# Patient Record
Sex: Female | Born: 1980 | Race: Black or African American | Hispanic: No | Marital: Married | Smoking: Never smoker
Health system: Southern US, Community
[De-identification: ages and names within clinical notes are randomized; demographics above are authoritative.]

## PROBLEM LIST (undated history)

## (undated) DIAGNOSIS — Z789 Other specified health status: Secondary | ICD-10-CM

## (undated) HISTORY — DX: Other specified health status: Z78.9

---

## 2014-01-10 NOTE — L&D Delivery Note (Signed)
Delivery Note  Patient was admitted for PROM. She was contracting regularly but was slow to progress, staying dilated at 2 cm for approximately 5 hours since admission, so she was started on pitocin to augment labor. She progressed to completion with a lip and delivered precipitously after voiding at bedside commode.   At 6:29 PM a viable and healthy female was delivered via Vaginal, Spontaneous Delivery (Presentation: Right Occiput Anterior).  APGAR: 8, 9; weight pending.   Placenta status: Intact, Spontaneous.  Cord:  with the following complications: None.  Cord pH: N/A  Anesthesia: None  Episiotomy: None Lacerations: None  Suture Repair: N/A Est. Blood Loss (mL):  100  Mom to postpartum.  Baby to Couplet care / Skin to Skin.  Jamelle Haring 10/18/2014, 6:56 PM

## 2014-09-24 ENCOUNTER — Encounter: Payer: Self-pay | Admitting: Physician Assistant

## 2014-09-24 ENCOUNTER — Ambulatory Visit (INDEPENDENT_AMBULATORY_CARE_PROVIDER_SITE_OTHER): Payer: Medicaid Other | Admitting: Physician Assistant

## 2014-09-24 VITALS — BP 117/72 | HR 75 | Temp 98.5°F | Ht 61.0 in | Wt 139.8 lb

## 2014-09-24 DIAGNOSIS — Z113 Encounter for screening for infections with a predominantly sexual mode of transmission: Secondary | ICD-10-CM

## 2014-09-24 DIAGNOSIS — O0933 Supervision of pregnancy with insufficient antenatal care, third trimester: Secondary | ICD-10-CM | POA: Diagnosis not present

## 2014-09-24 DIAGNOSIS — Z1151 Encounter for screening for human papillomavirus (HPV): Secondary | ICD-10-CM | POA: Diagnosis not present

## 2014-09-24 DIAGNOSIS — O0930 Supervision of pregnancy with insufficient antenatal care, unspecified trimester: Secondary | ICD-10-CM | POA: Diagnosis not present

## 2014-09-24 DIAGNOSIS — O09293 Supervision of pregnancy with other poor reproductive or obstetric history, third trimester: Secondary | ICD-10-CM

## 2014-09-24 DIAGNOSIS — O09899 Supervision of other high risk pregnancies, unspecified trimester: Secondary | ICD-10-CM | POA: Insufficient documentation

## 2014-09-24 DIAGNOSIS — Z98891 History of uterine scar from previous surgery: Secondary | ICD-10-CM

## 2014-09-24 DIAGNOSIS — Z789 Other specified health status: Secondary | ICD-10-CM

## 2014-09-24 DIAGNOSIS — Z124 Encounter for screening for malignant neoplasm of cervix: Secondary | ICD-10-CM | POA: Diagnosis not present

## 2014-09-24 DIAGNOSIS — O09893 Supervision of other high risk pregnancies, third trimester: Secondary | ICD-10-CM

## 2014-09-24 DIAGNOSIS — O09299 Supervision of pregnancy with other poor reproductive or obstetric history, unspecified trimester: Secondary | ICD-10-CM | POA: Insufficient documentation

## 2014-09-24 DIAGNOSIS — Z23 Encounter for immunization: Secondary | ICD-10-CM | POA: Diagnosis not present

## 2014-09-24 DIAGNOSIS — Z118 Encounter for screening for other infectious and parasitic diseases: Secondary | ICD-10-CM | POA: Diagnosis not present

## 2014-09-24 DIAGNOSIS — O34219 Maternal care for unspecified type scar from previous cesarean delivery: Secondary | ICD-10-CM | POA: Insufficient documentation

## 2014-09-24 DIAGNOSIS — Z9889 Other specified postprocedural states: Secondary | ICD-10-CM | POA: Diagnosis not present

## 2014-09-24 LAB — POCT URINALYSIS DIP (DEVICE)
BILIRUBIN URINE: NEGATIVE
Glucose, UA: NEGATIVE mg/dL
KETONES UR: NEGATIVE mg/dL
Nitrite: NEGATIVE
PH: 6 (ref 5.0–8.0)
Protein, ur: NEGATIVE mg/dL
Specific Gravity, Urine: 1.025 (ref 1.005–1.030)
Urobilinogen, UA: 0.2 mg/dL (ref 0.0–1.0)

## 2014-09-24 LAB — GLUCOSE TOLERANCE, 1 HOUR (50G) W/O FASTING: GLUCOSE 1 HOUR GTT: 86 mg/dL (ref 70–140)

## 2014-09-24 MED ORDER — TETANUS-DIPHTH-ACELL PERTUSSIS 5-2.5-18.5 LF-MCG/0.5 IM SUSP
0.5000 mL | Freq: Once | INTRAMUSCULAR | Status: AC
  Administered 2014-09-24: 0.5 mL via INTRAMUSCULAR

## 2014-09-24 MED ORDER — PRENATAL VITAMINS 0.8 MG PO TABS: 1.0000 | ORAL_TABLET | Freq: Every day | ORAL | Status: AC

## 2014-09-24 NOTE — Patient Instructions (Signed)
Third Trimester of Pregnancy The third trimester is from week 29 through week 42, months 7 through 9. The third trimester is a time when the fetus is growing rapidly. At the end of the ninth month, the fetus is about 20 inches in length and weighs 6-10 pounds.  BODY CHANGES Your body goes through many changes during pregnancy. The changes vary from woman to woman.   Your weight will continue to increase. You can expect to gain 25-35 pounds (11-16 kg) by the end of the pregnancy.  You may begin to get stretch marks on your hips, abdomen, and breasts.  You may urinate more often because the fetus is moving lower into your pelvis and pressing on your bladder.  You may develop or continue to have heartburn as a result of your pregnancy.  You may develop constipation because certain hormones are causing the muscles that push waste through your intestines to slow down.  You may develop hemorrhoids or swollen, bulging veins (varicose veins).  You may have pelvic pain because of the weight gain and pregnancy hormones relaxing your joints between the bones in your pelvis. Backaches may result from overexertion of the muscles supporting your posture.  You may have changes in your hair. These can include thickening of your hair, rapid growth, and changes in texture. Some women also have hair loss during or after pregnancy, or hair that feels dry or thin. Your hair will most likely return to normal after your baby is born.  Your breasts will continue to grow and be tender. A yellow discharge may leak from your breasts called colostrum.  Your belly button may stick out.  You may feel short of breath because of your expanding uterus.  You may notice the fetus "dropping," or moving lower in your abdomen.  You may have a bloody mucus discharge. This usually occurs a few days to a week before labor begins.  Your cervix becomes thin and soft (effaced) near your due date. WHAT TO EXPECT AT YOUR PRENATAL  EXAMS  You will have prenatal exams every 2 weeks until week 36. Then, you will have weekly prenatal exams. During a routine prenatal visit:  You will be weighed to make sure you and the fetus are growing normally.  Your blood pressure is taken.  Your abdomen will be measured to track your baby's growth.  The fetal heartbeat will be listened to.  Any test results from the previous visit will be discussed.  You may have a cervical check near your due date to see if you have effaced. At around 36 weeks, your caregiver will check your cervix. At the same time, your caregiver will also perform a test on the secretions of the vaginal tissue. This test is to determine if a type of bacteria, Group B streptococcus, is present. Your caregiver will explain this further. Your caregiver may ask you:  What your birth plan is.  How you are feeling.  If you are feeling the baby move.  If you have had any abnormal symptoms, such as leaking fluid, bleeding, severe headaches, or abdominal cramping.  If you have any questions. Other tests or screenings that may be performed during your third trimester include:  Blood tests that check for low iron levels (anemia).  Fetal testing to check the health, activity level, and growth of the fetus. Testing is done if you have certain medical conditions or if there are problems during the pregnancy. FALSE LABOR You may feel small, irregular contractions that   eventually go away. These are called Braxton Hicks contractions, or false labor. Contractions may last for hours, days, or even weeks before true labor sets in. If contractions come at regular intervals, intensify, or become painful, it is best to be seen by your caregiver.  SIGNS OF LABOR   Menstrual-like cramps.  Contractions that are 5 minutes apart or less.  Contractions that start on the top of the uterus and spread down to the lower abdomen and back.  A sense of increased pelvic pressure or back  pain.  A watery or bloody mucus discharge that comes from the vagina. If you have any of these signs before the 37th week of pregnancy, call your caregiver right away. You need to go to the hospital to get checked immediately. HOME CARE INSTRUCTIONS   Avoid all smoking, herbs, alcohol, and unprescribed drugs. These chemicals affect the formation and growth of the baby.  Follow your caregiver's instructions regarding medicine use. There are medicines that are either safe or unsafe to take during pregnancy.  Exercise only as directed by your caregiver. Experiencing uterine cramps is a good sign to stop exercising.  Continue to eat regular, healthy meals.  Wear a good support bra for breast tenderness.  Do not use hot tubs, steam rooms, or saunas.  Wear your seat belt at all times when driving.  Avoid raw meat, uncooked cheese, cat litter boxes, and soil used by cats. These carry germs that can cause birth defects in the baby.  Take your prenatal vitamins.  Try taking a stool softener (if your caregiver approves) if you develop constipation. Eat more high-fiber foods, such as fresh vegetables or fruit and whole grains. Drink plenty of fluids to keep your urine clear or pale yellow.  Take warm sitz baths to soothe any pain or discomfort caused by hemorrhoids. Use hemorrhoid cream if your caregiver approves.  If you develop varicose veins, wear support hose. Elevate your feet for 15 minutes, 3-4 times a day. Limit salt in your diet.  Avoid heavy lifting, wear low heal shoes, and practice good posture.  Rest a lot with your legs elevated if you have leg cramps or low back pain.  Visit your dentist if you have not gone during your pregnancy. Use a soft toothbrush to brush your teeth and be gentle when you floss.  A sexual relationship may be continued unless your caregiver directs you otherwise.  Do not travel far distances unless it is absolutely necessary and only with the approval  of your caregiver.  Take prenatal classes to understand, practice, and ask questions about the labor and delivery.  Make a trial run to the hospital.  Pack your hospital bag.  Prepare the baby's nursery.  Continue to go to all your prenatal visits as directed by your caregiver. SEEK MEDICAL CARE IF:  You are unsure if you are in labor or if your water has broken.  You have dizziness.  You have mild pelvic cramps, pelvic pressure, or nagging pain in your abdominal area.  You have persistent nausea, vomiting, or diarrhea.  You have a bad smelling vaginal discharge.  You have pain with urination. SEEK IMMEDIATE MEDICAL CARE IF:   You have a fever.  You are leaking fluid from your vagina.  You have spotting or bleeding from your vagina.  You have severe abdominal cramping or pain.  You have rapid weight loss or gain.  You have shortness of breath with chest pain.  You notice sudden or extreme swelling   of your face, hands, ankles, feet, or legs.  You have not felt your baby move in over an hour.  You have severe headaches that do not go away with medicine.  You have vision changes. Document Released: 12/21/2000 Document Revised: 01/01/2013 Document Reviewed: 02/28/2012 [employer] Patient Information 2015 [employer]. This information is not intended to replace advice given to you by your health care provider. Make sure you discuss any questions you have with your health care provider.  

## 2014-09-24 NOTE — Progress Notes (Signed)
Initial OB appointment New OB labs with 1 hour gtt New OB/28 wk Educational material given Pt needs written prescriptions Flu/Tdap vaccine Interpreter present for encounter Schedule ultrasound Hgb: trace, Leukocytes: moderate Breastfeeding tip of the week reviewed

## 2014-09-24 NOTE — Progress Notes (Signed)
  Subjective:    Suzanne Mccoy is a [id number] [redacted]w[redacted]d being seen today for her first obstetrical visit.  Her obstetrical history is significant for prior c-section for breech and IUFD (unknown cause).. Patient does intend to breast feed. Pregnancy history fully reviewed.  Patient reports no complaints.  Filed Vitals:   Oct 08, 2014 0804 08-Oct-2014 0806  BP: 117/72   Pulse: 75   Temp: 98.5 F (36.9 C)   Height:   (1.549 m)  Weight: 139 lb 12.8 oz (63.413 kg)     HISTORY: OB History  Gravida Para Term Preterm AB SAB TAB Ectopic Multiple Living  # Outcome Date GA Lbr Len/2nd Weight Sex Delivery Anes PTL Lv  5 Current           4 Term 06/11/11 [redacted]w[redacted]d   M    FD  3 Term 10/11/06 [redacted]w[redacted]d   F CS-Unspec   Y  2 Term 08/10/01 [redacted]w[redacted]d   Kateri Plummer Y  1 Term 07/10/00 [redacted]w[redacted]d   F Vag-Spont   Y     Past Medical History  Diagnosis Date  . Medical history non-contributory    Past Surgical History  Procedure Laterality Date  . Cesarean section     History reviewed. No pertinent family history.   Exam    Uterus:   gravid  Pelvic Exam:    Perineum: No Hemorrhoids, Normal Perineum   Vulva: normal   Vagina:  normal discharge   pH:     Cervix: no cervical motion tenderness and bleeding present following pap   Adnexa: normal adnexa   Bony Pelvis: average  System: Breast:  Inspection negative   Skin: normal coloration and turgor, no rashes    Neurologic: oriented, grossly non-focal, normal mood   Extremities: normal strength, tone, and muscle mass   HEENT extra ocular movement intact   Mouth/Teeth mucous membranes moist, pharynx normal without lesions and dental hygiene poor   Neck supple   Cardiovascular: regular rate and rhythm   Respiratory:  appears well, vitals normal, no respiratory distress, acyanotic, normal RR, ear and throat exam is normal, neck free of mass or lymphadenopathy, chest clear, no wheezing, crepitations, rhonchi, normal symmetric air entry   Abdomen: soft, non-tender; bowel sounds normal; no masses,  no organomegaly   Urinary: urethral meatus normal      Assessment:    Pregnancy: [id number] Patient Active Problem List   Diagnosis Date Noted  . Supervision of other high-risk pregnancy 2014-10-08  . Hx of intrauterine fetal death, currently pregnant 10/08/2014  . Late prenatal care affecting pregnancy in third trimester, antepartum 08-Oct-2014  . H/O: C-section 10-08-2014  . Language barrier affecting health care October 08, 2014        Plan:     Initial labs drawn. Prenatal vitamins. Rx provided Problem list reviewed and updated. Genetic Screening discussed Quad Screen: too late.  Ultrasound discussed; fetal survey: ordered. 09/29/14  Follow up in 1 weeks. 90% of 45 min visit spent on counseling and coordination of care.  VBAC consent signed with interpreter One hour gtt today     Bertram Denver 08-Oct-2014

## 2014-09-25 LAB — CYTOLOGY - PAP

## 2014-09-25 LAB — GC/CHLAMYDIA PROBE AMP (~~LOC~~) NOT AT ARMC
CHLAMYDIA, DNA PROBE: NEGATIVE
NEISSERIA GONORRHEA: NEGATIVE

## 2014-09-25 LAB — CULTURE, OB URINE
Colony Count: NO GROWTH
ORGANISM ID, BACTERIA: NO GROWTH

## 2014-09-26 ENCOUNTER — Encounter: Payer: Self-pay | Admitting: *Deleted

## 2014-09-26 LAB — HEMOGLOBINOPATHY EVALUATION
HGB A2 QUANT: 2.6 % (ref 2.2–3.2)
HGB A: 97.4 % (ref 96.8–97.8)
Hemoglobin Other: 0 %
Hgb F Quant: 0 % (ref 0.0–2.0)
Hgb S Quant: 0 %

## 2014-09-26 LAB — PRESCRIPTION MONITORING PROFILE (19 PANEL)
Amphetamine/Meth: NEGATIVE ng/mL
BARBITURATE SCREEN, URINE: NEGATIVE ng/mL
Benzodiazepine Screen, Urine: NEGATIVE ng/mL
Buprenorphine, Urine: NEGATIVE ng/mL
CANNABINOID SCRN UR: NEGATIVE ng/mL
CARISOPRODOL, URINE: NEGATIVE ng/mL
COCAINE METABOLITES: NEGATIVE ng/mL
CREATININE, URINE: 96.02 mg/dL (ref >20.0)
Fentanyl, Ur: NEGATIVE ng/mL
MDMA URINE: NEGATIVE ng/mL
MEPERIDINE UR: NEGATIVE ng/mL
METHADONE SCREEN, URINE: NEGATIVE ng/mL
Methaqualone: NEGATIVE ng/mL
Nitrites, Initial: NEGATIVE ug/mL
OPIATE SCREEN, URINE: NEGATIVE ng/mL
OXYCODONE SCRN UR: NEGATIVE ng/mL
PH URINE, INITIAL: 5.8 pH (ref 4.5–8.9)
Phencyclidine, Ur: NEGATIVE ng/mL
Propoxyphene: NEGATIVE ng/mL
TAPENTADOLUR: NEGATIVE ng/mL
TRAMADOL UR: NEGATIVE ng/mL
Zolpidem, Urine: NEGATIVE ng/mL

## 2014-09-26 LAB — PRENATAL PROFILE (SOLSTAS)
Antibody Screen: NEGATIVE
Basophils Absolute: 0 10*3/uL (ref 0.0–0.1)
Basophils Relative: 0 % (ref 0–1)
Eosinophils Absolute: 0.1 10*3/uL (ref 0.0–0.7)
Eosinophils Relative: 1 % (ref 0–5)
HCT: 35.1 % — ABNORMAL LOW (ref 36.0–46.0)
HEP B S AG: NEGATIVE
HIV: NONREACTIVE
Hemoglobin: 11.9 g/dL — ABNORMAL LOW (ref 12.0–15.0)
LYMPHS ABS: 1.9 10*3/uL (ref 0.7–4.0)
LYMPHS PCT: 25 % (ref 12–46)
MCH: 27.4 pg (ref 26.0–34.0)
MCHC: 33.9 g/dL (ref 30.0–36.0)
MCV: 80.7 fL (ref 78.0–100.0)
MONO ABS: 0.5 10*3/uL (ref 0.1–1.0)
MONOS PCT: 7 % (ref 3–12)
MPV: 10.7 fL (ref 8.6–12.4)
NEUTROS ABS: 5 10*3/uL (ref 1.7–7.7)
Neutrophils Relative %: 67 % (ref 43–77)
Platelets: 133 10*3/uL — ABNORMAL LOW (ref 150–400)
RBC: 4.35 MIL/uL (ref 3.87–5.11)
RDW: 14.2 % (ref 11.5–15.5)
RUBELLA: 15.2 {index} — AB (ref <0.90)
Rh Type: POSITIVE
WBC: 7.4 10*3/uL (ref 4.0–10.5)

## 2014-09-28 ENCOUNTER — Encounter: Payer: Self-pay | Admitting: Family Medicine

## 2014-09-29 ENCOUNTER — Other Ambulatory Visit: Payer: Self-pay | Admitting: Physician Assistant

## 2014-09-29 ENCOUNTER — Ambulatory Visit (HOSPITAL_COMMUNITY)
Admission: RE | Admit: 2014-09-29 | Discharge: 2014-09-29 | Disposition: A | Discharge status: Home / Self Care | Payer: Medicaid Other | Source: Ambulatory Visit | Attending: Physician Assistant | Admitting: Physician Assistant

## 2014-09-29 ENCOUNTER — Other Ambulatory Visit: Payer: Self-pay | Admitting: *Deleted

## 2014-09-29 DIAGNOSIS — Z3A34 34 weeks gestation of pregnancy: Secondary | ICD-10-CM | POA: Insufficient documentation

## 2014-09-29 DIAGNOSIS — Z36 Encounter for antenatal screening of mother: Secondary | ICD-10-CM | POA: Diagnosis not present

## 2014-09-29 DIAGNOSIS — O0930 Supervision of pregnancy with insufficient antenatal care, unspecified trimester: Secondary | ICD-10-CM

## 2014-09-29 DIAGNOSIS — Z1389 Encounter for screening for other disorder: Secondary | ICD-10-CM

## 2014-09-29 DIAGNOSIS — O09299 Supervision of pregnancy with other poor reproductive or obstetric history, unspecified trimester: Secondary | ICD-10-CM | POA: Insufficient documentation

## 2014-09-29 DIAGNOSIS — O09293 Supervision of pregnancy with other poor reproductive or obstetric history, third trimester: Secondary | ICD-10-CM

## 2014-10-02 ENCOUNTER — Ambulatory Visit (INDEPENDENT_AMBULATORY_CARE_PROVIDER_SITE_OTHER): Payer: Medicaid Other | Admitting: Family Medicine

## 2014-10-02 VITALS — BP 113/76 | HR 79 | Temp 98.3°F | Wt 142.0 lb

## 2014-10-02 DIAGNOSIS — O09293 Supervision of pregnancy with other poor reproductive or obstetric history, third trimester: Secondary | ICD-10-CM

## 2014-10-02 DIAGNOSIS — Z789 Other specified health status: Secondary | ICD-10-CM

## 2014-10-02 DIAGNOSIS — O0933 Supervision of pregnancy with insufficient antenatal care, third trimester: Secondary | ICD-10-CM | POA: Diagnosis not present

## 2014-10-02 DIAGNOSIS — O34219 Maternal care for unspecified type scar from previous cesarean delivery: Secondary | ICD-10-CM

## 2014-10-02 DIAGNOSIS — O3421 Maternal care for scar from previous cesarean delivery: Secondary | ICD-10-CM

## 2014-10-02 DIAGNOSIS — O09893 Supervision of other high risk pregnancies, third trimester: Secondary | ICD-10-CM | POA: Diagnosis not present

## 2014-10-02 LAB — POCT URINALYSIS DIP (DEVICE)
Bilirubin Urine: NEGATIVE
GLUCOSE, UA: NEGATIVE mg/dL
Ketones, ur: NEGATIVE mg/dL
NITRITE: NEGATIVE
PH: 5.5 (ref 5.0–8.0)
Protein, ur: NEGATIVE mg/dL
Specific Gravity, Urine: 1.025 (ref 1.005–1.030)
Urobilinogen, UA: 0.2 mg/dL (ref 0.0–1.0)

## 2014-10-02 NOTE — Patient Instructions (Addendum)
For your cold: Tylenol Cold and Flu Mucinex   Places to get CIRCUMCISION:    [facility] [phone] $480 by 4 wks  [facility] [phone] $244 by 4 wks  [facility] [phone] $175 by 2 wks  [facility] [phone] $250 by 7 days [facility] [phone] $150 by 4 wks  ([facility]) ** perform well child care**   Breastfeeding Deciding to breastfeed is one of the best choices you can make for you and your baby. A change in hormones during pregnancy causes your breast tissue to grow and increases the number and size of your milk ducts. These hormones also allow proteins, sugars, and fats from your blood supply to make breast milk in your milk-producing glands. Hormones prevent breast milk from being released before your baby is born as well as prompt milk flow after birth. Once breastfeeding has begun, thoughts of your baby, as well as his or her sucking or crying, can stimulate the release of milk from your milk-producing glands.  BENEFITS OF BREASTFEEDING For Your Baby  Your first milk (colostrum) helps your baby's digestive system function better.   There are antibodies in your milk that help your baby fight off infections.   Your baby has a lower incidence of asthma, allergies, and sudden infant death syndrome.   The nutrients in breast milk are better for your baby than infant formulas and are designed uniquely for your baby's needs.   Breast milk improves your baby's brain development.   Your baby is less likely to develop other conditions, such as childhood obesity, asthma, or type 2 diabetes mellitus.  For You   Breastfeeding helps to create a very special bond between you and your baby.   Breastfeeding is convenient. Breast milk is always available at the correct temperature and costs  nothing.   Breastfeeding helps to burn calories and helps you lose the weight gained during pregnancy.   Breastfeeding makes your uterus contract to its prepregnancy size faster and slows bleeding (lochia) after you give birth.   Breastfeeding helps to lower your risk of developing type 2 diabetes mellitus, osteoporosis, and breast or ovarian cancer later in life. SIGNS THAT YOUR BABY IS HUNGRY Early Signs of Hunger  Increased alertness or activity.  Stretching.  Movement of the head from side to side.  Movement of the head and opening of the mouth when the corner of the mouth or cheek is stroked (rooting).  Increased sucking sounds, smacking lips, cooing, sighing, or squeaking.  Hand-to-mouth movements.  Increased sucking of fingers or hands. Late Signs of Hunger  Fussing.  Intermittent crying. Extreme Signs of Hunger Signs of extreme hunger will require calming and consoling before your baby will be able to breastfeed successfully. Do not wait for the following signs of extreme hunger to occur before you initiate breastfeeding:   Restlessness.  A loud, strong cry.   Screaming. BREASTFEEDING BASICS Breastfeeding Initiation  Find a comfortable place to sit or lie down, with your neck and back well supported.  Place a pillow or rolled up blanket under your baby to bring him or her to the level of your breast (if you are seated). Nursing pillows are specially designed to help support your arms and your baby while you breastfeed.  Make sure that your baby's abdomen is facing your abdomen.   Gently massage your breast. With your fingertips, massage from your chest wall toward your nipple in a circular motion. This encourages milk flow. You may need to continue this action  during the feeding if your milk flows slowly.  Support your breast with 4 fingers underneath and your thumb above your nipple. Make sure your fingers are well away from your nipple and your baby's  mouth.   Stroke your baby's lips gently with your finger or nipple.   When your baby's mouth is open wide enough, quickly bring your baby to your breast, placing your entire nipple and as much of the colored area around your nipple (areola) as possible into your baby's mouth.   More areola should be visible above your baby's upper lip than below the lower lip.   Your baby's tongue should be between his or her lower gum and your breast.   Ensure that your baby's mouth is correctly positioned around your nipple (latched). Your baby's lips should create a seal on your breast and be turned out (everted).  It is common for your baby to suck about 2-3 minutes in order to start the flow of breast milk. Latching Teaching your baby how to latch on to your breast properly is very important. An improper latch can cause nipple pain and decreased milk supply for you and poor weight gain in your baby. Also, if your baby is not latched onto your nipple properly, he or she may swallow some air during feeding. This can make your baby fussy. Burping your baby when you switch breasts during the feeding can help to get rid of the air. However, teaching your baby to latch on properly is still the best way to prevent fussiness from swallowing air while breastfeeding. Signs that your baby has successfully latched on to your nipple:    Silent tugging or silent sucking, without causing you pain.   Swallowing heard between every 3-4 sucks.    Muscle movement above and in front of his or her ears while sucking.  Signs that your baby has not successfully latched on to nipple:   Sucking sounds or smacking sounds from your baby while breastfeeding.  Nipple pain. If you think your baby has not latched on correctly, slip your finger into the corner of your baby's mouth to break the suction and place it between your baby's gums. Attempt breastfeeding initiation again. Signs of Successful Breastfeeding Signs  from your baby:   A gradual decrease in the number of sucks or complete cessation of sucking.   Falling asleep.   Relaxation of his or her body.   Retention of a small amount of milk in his or her mouth.   Letting go of your breast by himself or herself. Signs from you:  Breasts that have increased in firmness, weight, and size 1-3 hours after feeding.   Breasts that are softer immediately after breastfeeding.  Increased milk volume, as well as a change in milk consistency and color by the fifth day of breastfeeding.   Nipples that are not sore, cracked, or bleeding. Signs That Your Pecola Leisure is Getting Enough Milk  Wetting at least 3 diapers in a 24-hour period. The urine should be clear and pale yellow by age 61 days.  At least 3 stools in a 24-hour period by age 61 days. The stool should be soft and yellow.  At least 3 stools in a 24-hour period by age 92 days. The stool should be seedy and yellow.  No loss of weight greater than 10% of birth weight during the first 32 days of age.  Average weight gain of 4-7 ounces (113-198 g) per week after age 43 days.  Consistent daily weight gain by age 27 days, without weight loss after the age of 2 weeks. After a feeding, your baby may spit up a small amount. This is common. BREASTFEEDING FREQUENCY AND DURATION Frequent feeding will help you make more milk and can prevent sore nipples and breast engorgement. Breastfeed when you feel the need to reduce the fullness of your breasts or when your baby shows signs of hunger. This is called "breastfeeding on demand." Avoid introducing a pacifier to your baby while you are working to establish breastfeeding (the first 4-6 weeks after your baby is born). After this time you may choose to use a pacifier. Research has shown that pacifier use during the first year of a baby's life decreases the risk of sudden infant death syndrome (SIDS). Allow your baby to feed on each breast as long as he or she  wants. Breastfeed until your baby is finished feeding. When your baby unlatches or falls asleep while feeding from the first breast, offer the second breast. Because newborns are often sleepy in the first few weeks of life, you may need to awaken your baby to get him or her to feed. Breastfeeding times will vary from baby to baby. However, the following rules can serve as a guide to help you ensure that your baby is properly fed:  Newborns (babies 43 weeks of age or younger) may breastfeed every 1-3 hours.  Newborns should not go longer than 3 hours during the day or 5 hours during the night without breastfeeding.  You should breastfeed your baby a minimum of 8 times in a 24-hour period until you begin to introduce solid foods to your baby at around 74 months of age. BREAST MILK PUMPING Pumping and storing breast milk allows you to ensure that your baby is exclusively fed your breast milk, even at times when you are unable to breastfeed. This is especially important if you are going back to work while you are still breastfeeding or when you are not able to be present during feedings. Your lactation consultant can give you guidelines on how long it is safe to store breast milk.  A breast pump is a machine that allows you to pump milk from your breast into a sterile bottle. The pumped breast milk can then be stored in a refrigerator or freezer. Some breast pumps are operated by hand, while others use electricity. Ask your lactation consultant which type will work best for you. Breast pumps can be purchased, but some hospitals and breastfeeding support groups lease breast pumps on a monthly basis. A lactation consultant can teach you how to hand express breast milk, if you prefer not to use a pump.  CARING FOR YOUR BREASTS WHILE YOU BREASTFEED Nipples can become dry, cracked, and sore while breastfeeding. The following recommendations can help keep your breasts moisturized and healthy:  Avoid using soap on  your nipples.   Wear a supportive bra. Although not required, special nursing bras and tank tops are designed to allow access to your breasts for breastfeeding without taking off your entire bra or top. Avoid wearing underwire-style bras or extremely tight bras.  Air dry your nipples for 3-65minutes after each feeding.   Use only cotton bra pads to absorb leaked breast milk. Leaking of breast milk between feedings is normal.   Use lanolin on your nipples after breastfeeding. Lanolin helps to maintain your skin's normal moisture barrier. If you use pure lanolin, you do not need to wash it off before feeding  your baby again. Pure lanolin is not toxic to your baby. You may also hand express a few drops of breast milk and gently massage that milk into your nipples and allow the milk to air dry. In the first few weeks after giving birth, some women experience extremely full breasts (engorgement). Engorgement can make your breasts feel heavy, warm, and tender to the touch. Engorgement peaks within 3-5 days after you give birth. The following recommendations can help ease engorgement:  Completely empty your breasts while breastfeeding or pumping. You may want to start by applying warm, moist heat (in the shower or with warm water-soaked hand towels) just before feeding or pumping. This increases circulation and helps the milk flow. If your baby does not completely empty your breasts while breastfeeding, pump any extra milk after he or she is finished.  Wear a snug bra (nursing or regular) or tank top for 1-2 days to signal your body to slightly decrease milk production.  Apply ice packs to your breasts, unless this is too uncomfortable for you.  Make sure that your baby is latched on and positioned properly while breastfeeding. If engorgement persists after 48 hours of following these recommendations, contact your health care provider or a Advertising copywriter. OVERALL HEALTH CARE RECOMMENDATIONS  WHILE BREASTFEEDING  Eat healthy foods. Alternate between meals and snacks, eating 3 of each per day. Because what you eat affects your breast milk, some of the foods may make your baby more irritable than usual. Avoid eating these foods if you are sure that they are negatively affecting your baby.  Drink milk, fruit juice, and water to satisfy your thirst (about 10 glasses a day).   Rest often, relax, and continue to take your prenatal vitamins to prevent fatigue, stress, and anemia.  Continue breast self-awareness checks.  Avoid chewing and smoking tobacco.  Avoid alcohol and drug use. Some medicines that may be harmful to your baby can pass through breast milk. It is important to ask your health care provider before taking any medicine, including all over-the-counter and prescription medicine as well as vitamin and herbal supplements. It is possible to become pregnant while breastfeeding. If birth control is desired, ask your health care provider about options that will be safe for your baby. SEEK MEDICAL CARE IF:   You feel like you want to stop breastfeeding or have become frustrated with breastfeeding.  You have painful breasts or nipples.  Your nipples are cracked or bleeding.  Your breasts are red, tender, or warm.  You have a swollen area on either breast.  You have a fever or chills.  You have nausea or vomiting.  You have drainage other than breast milk from your nipples.  Your breasts do not become full before feedings by the fifth day after you give birth.  You feel sad and depressed.  Your baby is too sleepy to eat well.  Your baby is having trouble sleeping.   Your baby is wetting less than 3 diapers in a 24-hour period.  Your baby has less than 3 stools in a 24-hour period.  Your baby's skin or the white part of his or her eyes becomes yellow.   Your baby is not gaining weight by 45 days of age. SEEK IMMEDIATE MEDICAL CARE IF:   Your baby is overly  tired (lethargic) and does not want to wake up and feed.  Your baby develops an unexplained fever. Document Released: 12/27/2004 Document Revised: 01/01/2013 Document Reviewed: 06/20/2012 [employer] Patient Information 2015 [employer]. This information is not intended to replace advice given to you by your health care provider. Make sure you discuss any questions you have with your health care provider.    Third Trimester of Pregnancy The third trimester is from week 29 through week 42, months 7 through 9. The third trimester is a time when the fetus is growing rapidly. At the end of the ninth month, the fetus is about 20 inches in length and weighs 6-10 pounds.  BODY CHANGES Your body goes through many changes during pregnancy. The changes vary from woman to woman.   Your weight will continue to increase. You can expect to gain 25-35 pounds (11-16 kg) by the end of the pregnancy.  You may begin to get stretch marks on your hips, abdomen, and breasts.  You may urinate more often because the fetus is moving lower into your pelvis and pressing on your bladder.  You may develop or continue to have heartburn as a result of your pregnancy.  You may develop constipation because certain hormones are causing the muscles that push waste through your intestines to slow down.  You may develop hemorrhoids or swollen, bulging veins (varicose veins).  You may have pelvic pain because of the weight gain and pregnancy hormones relaxing your joints between the bones in your pelvis. Backaches may result from overexertion of the muscles supporting your posture.  You may have changes in your hair. These can include thickening of your hair, rapid growth, and changes in texture. Some women also have hair loss during or after pregnancy, or hair that feels dry or thin. Your hair will most likely return to normal after your baby is born.  Your breasts will continue to grow and be tender. A yellow discharge  may leak from your breasts called colostrum.  Your belly button may stick out.  You may feel short of breath because of your expanding uterus.  You may notice the fetus "dropping," or moving lower in your abdomen.  You may have a bloody mucus discharge. This usually occurs a few days to a week before labor begins.  Your cervix becomes thin and soft (effaced) near your due date. WHAT TO EXPECT AT YOUR PRENATAL EXAMS  You will have prenatal exams every 2 weeks until week 36. Then, you will have weekly prenatal exams. During a routine prenatal visit:  You will be weighed to make sure you and the fetus are growing normally.  Your blood pressure is taken.  Your abdomen will be measured to track your baby's growth.  The fetal heartbeat will be listened to.  Any test results from the previous visit will be discussed.  You may have a cervical check near your due date to see if you have effaced. At around 36 weeks, your caregiver will check your cervix. At the same time, your caregiver will also perform a test on the secretions of the vaginal tissue. This test is to determine if a type of bacteria, Group B streptococcus, is present. Your caregiver will explain this further. Your caregiver may ask you:  What your birth plan is.  How you are feeling.  If you are feeling the baby move.  If you have had any abnormal symptoms, such as leaking fluid, bleeding, severe headaches, or abdominal cramping.  If you have any questions. Other tests or screenings that may be performed during your third trimester include:  Blood tests that check for low iron levels (anemia).  Fetal testing to check the health,  activity level, and growth of the fetus. Testing is done if you have certain medical conditions or if there are problems during the pregnancy. FALSE LABOR You may feel small, irregular contractions that eventually go away. These are called Braxton Hicks contractions, or false labor.  Contractions may last for hours, days, or even weeks before true labor sets in. If contractions come at regular intervals, intensify, or become painful, it is best to be seen by your caregiver.  SIGNS OF LABOR   Menstrual-like cramps.  Contractions that are 5 minutes apart or less.  Contractions that start on the top of the uterus and spread down to the lower abdomen and back.  A sense of increased pelvic pressure or back pain.  A watery or bloody mucus discharge that comes from the vagina. If you have any of these signs before the 37th week of pregnancy, call your caregiver right away. You need to go to the hospital to get checked immediately. HOME CARE INSTRUCTIONS   Avoid all smoking, herbs, alcohol, and unprescribed drugs. These chemicals affect the formation and growth of the baby.  Follow your caregiver's instructions regarding medicine use. There are medicines that are either safe or unsafe to take during pregnancy.  Exercise only as directed by your caregiver. Experiencing uterine cramps is a good sign to stop exercising.  Continue to eat regular, healthy meals.  Wear a good support bra for breast tenderness.  Do not use hot tubs, steam rooms, or saunas.  Wear your seat belt at all times when driving.  Avoid raw meat, uncooked cheese, cat litter boxes, and soil used by cats. These carry germs that can cause birth defects in the baby.  Take your prenatal vitamins.  Try taking a stool softener (if your caregiver approves) if you develop constipation. Eat more high-fiber foods, such as fresh vegetables or fruit and whole grains. Drink plenty of fluids to keep your urine clear or pale yellow.  Take warm sitz baths to soothe any pain or discomfort caused by hemorrhoids. Use hemorrhoid cream if your caregiver approves.  If you develop varicose veins, wear support hose. Elevate your feet for 15 minutes, 3-4 times a day. Limit salt in your diet.  Avoid heavy lifting, wear low  heal shoes, and practice good posture.  Rest a lot with your legs elevated if you have leg cramps or low back pain.  Visit your dentist if you have not gone during your pregnancy. Use a soft toothbrush to brush your teeth and be gentle when you floss.  A sexual relationship may be continued unless your caregiver directs you otherwise.  Do not travel far distances unless it is absolutely necessary and only with the approval of your caregiver.  Take prenatal classes to understand, practice, and ask questions about the labor and delivery.  Make a trial run to the hospital.  Pack your hospital bag.  Prepare the baby's nursery.  Continue to go to all your prenatal visits as directed by your caregiver. SEEK MEDICAL CARE IF:  You are unsure if you are in labor or if your water has broken.  You have dizziness.  You have mild pelvic cramps, pelvic pressure, or nagging pain in your abdominal area.  You have persistent nausea, vomiting, or diarrhea.  You have a bad smelling vaginal discharge.  You have pain with urination. SEEK IMMEDIATE MEDICAL CARE IF:   You have a fever.  You are leaking fluid from your vagina.  You have spotting or bleeding from your  vagina.  You have severe abdominal cramping or pain.  You have rapid weight loss or gain.  You have shortness of breath with chest pain.  You notice sudden or extreme swelling of your face, hands, ankles, feet, or legs.  You have not felt your baby move in over an hour.  You have severe headaches that do not go away with medicine.  You have vision changes. Document Released: 12/21/2000 Document Revised: 01/01/2013 Document Reviewed: 02/28/2012 [facility] Patient Information 2015 [place], [place]. This information is not intended to replace advice given to you by your health care provider. Make sure you discuss any questions you have with your health care provider.

## 2014-10-02 NOTE — Progress Notes (Signed)
Used [employer] [phone]. Pt. States has been in [place] 2 weeks .Traveled from [place]. C/o has had fever, cough.Mask applied to patient until correct precautions are determined.  Moderate lleukocytes noted.

## 2014-10-02 NOTE — Progress Notes (Signed)
Subjective:  Suzanne Mccoy is a 34 y.o. G5P4003 at [redacted]w[redacted]d being seen today for ongoing prenatal care.  Patient reports fever, cough.  Contractions: Not present.  Vag. Bleeding: None. Movement: Present. Denies leaking of fluid.   Fever and cough- started 2 days ago. The children have a cold as well. Reports no other known sick contacts in her community. Did not check her temperature at home. Reports nasal congestion. Cough is productive, color is unkown. Denies blood in phelgm.    The following portions of the patient's history were reviewed and updated as appropriate: allergies, current medications, past family history, past medical history, past social history, past surgical history and problem list.   Objective:   Filed Vitals:   10/02/14 0847  BP: 113/76  Pulse: 79  Temp: 98.3 F (36.8 C)  Weight: 142 lb (64.411 kg)    Fetal Status: Fetal Heart Rate (bpm): 122 Fundal Height: 35 cm Movement: Present     General:  Alert, oriented and cooperative. Patient is in no acute distress.  Skin: Skin is warm and dry. No rash noted.   Cardiovascular: Normal heart rate noted  Respiratory: Normal respiratory effort, no problems with respiration noted  Abdomen: Soft, gravid, appropriate for gestational age. Pain/Pressure: Present     Pelvic: Vag. Bleeding: None     Cervical exam deferred        Extremities: Normal range of motion.  Edema: Trace  Mental Status: Normal mood and affect. Normal behavior. Normal judgment and thought content.   Urinalysis: Urine Protein: Negative Urine Glucose: Negative  Assessment and Plan:  Pregnancy: G5P4003 at [redacted]w[redacted]d  1. Hx of intrauterine fetal death, currently pregnant, third trimester - Needs to start testing today - NST twice weekly  2. Late prenatal care affecting pregnancy in third trimester, antepartum -UTD currently -Updated pregnancy overview  3. Previous cesarean delivery affecting pregnancy, antepartum - VBAC signed and in media  4.  Language barrier affecting health care Used interpreter  5. URI sx: likely viral - Recommended use of mask here in clinic - Recommended OTC medications  Preterm labor symptoms and general obstetric precautions including but not limited to vaginal bleeding, contractions, leaking of fluid and fetal movement were reviewed in detail with the patient. Please refer to After Visit Summary for other counseling recommendations.   Return in about 1 week (around 10/09/2014) for NSTs twice weekly, Appt with MD in 2 weeks.   Federico Flake, MD

## 2014-10-02 NOTE — Addendum Note (Signed)
Addended by: Jill Side on: 10/02/2014 01:56 PM   Modules accepted: Orders

## 2014-10-06 ENCOUNTER — Ambulatory Visit (INDEPENDENT_AMBULATORY_CARE_PROVIDER_SITE_OTHER): Payer: Medicaid Other | Admitting: *Deleted

## 2014-10-06 ENCOUNTER — Other Ambulatory Visit: Payer: Self-pay

## 2014-10-06 VITALS — BP 102/60 | HR 66

## 2014-10-06 DIAGNOSIS — O09293 Supervision of pregnancy with other poor reproductive or obstetric history, third trimester: Secondary | ICD-10-CM | POA: Diagnosis not present

## 2014-10-06 NOTE — Progress Notes (Signed)
nst reactive

## 2014-10-06 NOTE — Progress Notes (Signed)
Interpreter Redgie Grayer present for encounter

## 2014-10-09 ENCOUNTER — Ambulatory Visit (INDEPENDENT_AMBULATORY_CARE_PROVIDER_SITE_OTHER): Payer: Medicaid Other | Admitting: Family Medicine

## 2014-10-09 VITALS — BP 108/65 | HR 73 | Wt 144.1 lb

## 2014-10-09 DIAGNOSIS — O3421 Maternal care for scar from previous cesarean delivery: Secondary | ICD-10-CM

## 2014-10-09 DIAGNOSIS — O09893 Supervision of other high risk pregnancies, third trimester: Secondary | ICD-10-CM

## 2014-10-09 DIAGNOSIS — O34219 Maternal care for unspecified type scar from previous cesarean delivery: Secondary | ICD-10-CM

## 2014-10-09 DIAGNOSIS — O09293 Supervision of pregnancy with other poor reproductive or obstetric history, third trimester: Secondary | ICD-10-CM

## 2014-10-09 LAB — POCT URINALYSIS DIP (DEVICE)
BILIRUBIN URINE: NEGATIVE
Glucose, UA: NEGATIVE mg/dL
Ketones, ur: NEGATIVE mg/dL
NITRITE: NEGATIVE
PH: 5.5 (ref 5.0–8.0)
PROTEIN: NEGATIVE mg/dL
Specific Gravity, Urine: 1.025 (ref 1.005–1.030)
Urobilinogen, UA: 0.2 mg/dL (ref 0.0–1.0)

## 2014-10-09 NOTE — Progress Notes (Signed)
Subjective:  Suzanne Mccoy is a 33 y.o. G5P4003 at [redacted]w[redacted]d being seen today for ongoing prenatal care.  Patient reports pedal edema.  Contractions: Not present.  Vag. Bleeding: None. Movement: Present. Denies leaking of fluid.   The following portions of the patient's history were reviewed and updated as appropriate: allergies, current medications, past family history, past medical history, past social history, past surgical history and problem list.   Objective:   Filed Vitals:   10/09/14 0925  BP: 108/65  Pulse: 73  Weight: 144 lb 1.6 oz (65.363 kg)    Fetal Status: Fetal Heart Rate (bpm): NST   Movement: Present     General:  Alert, oriented and cooperative. Patient is in no acute distress.  Skin: Skin is warm and dry. No rash noted.   Cardiovascular: Normal heart rate noted  Respiratory: Normal respiratory effort, no problems with respiration noted  Abdomen: Soft, gravid, appropriate for gestational age. Pain/Pressure: Absent     Pelvic: Vag. Bleeding: None     Cervical exam deferred        Extremities: Normal range of motion.  Edema: Trace  Mental Status: Normal mood and affect. Normal behavior. Normal judgment and thought content.   Urinalysis:      Assessment and Plan:  Pregnancy: G5P4003 at [redacted]w[redacted]d  1. Hx of intrauterine fetal death, currently pregnant, third trimester NST today: reactive Has BPP and f/u fetal weight on 10/3. - Fetal nonstress test  2. Supervision of other high-risk pregnancy, third trimester FHT normal  3. Previous cesarean delivery affecting pregnancy, antepartum   Preterm labor symptoms and general obstetric precautions including but not limited to vaginal bleeding, contractions, leaking of fluid and fetal movement were reviewed in detail with the patient. Please refer to After Visit Summary for other counseling recommendations.  Return in about 7 days (around 10/16/2014) for as scheduled.   Levie Heritage, DO

## 2014-10-09 NOTE — Progress Notes (Signed)
[place] for growth and BPP on 10/3

## 2014-10-09 NOTE — Patient Instructions (Signed)
Third Trimester of Pregnancy The third trimester is from week 29 through week 42, months 7 through 9. The third trimester is a time when the fetus is growing rapidly. At the end of the ninth month, the fetus is about 20 inches in length and weighs 6-10 pounds.  BODY CHANGES Your body goes through many changes during pregnancy. The changes vary from woman to woman.   Your weight will continue to increase. You can expect to gain 25-35 pounds (11-16 kg) by the end of the pregnancy.  You may begin to get stretch marks on your hips, abdomen, and breasts.  You may urinate more often because the fetus is moving lower into your pelvis and pressing on your bladder.  You may develop or continue to have heartburn as a result of your pregnancy.  You may develop constipation because certain hormones are causing the muscles that push waste through your intestines to slow down.  You may develop hemorrhoids or swollen, bulging veins (varicose veins).  You may have pelvic pain because of the weight gain and pregnancy hormones relaxing your joints between the bones in your pelvis. Backaches may result from overexertion of the muscles supporting your posture.  You may have changes in your hair. These can include thickening of your hair, rapid growth, and changes in texture. Some women also have hair loss during or after pregnancy, or hair that feels dry or thin. Your hair will most likely return to normal after your baby is born.  Your breasts will continue to grow and be tender. A yellow discharge may leak from your breasts called colostrum.  Your belly button may stick out.  You may feel short of breath because of your expanding uterus.  You may notice the fetus "dropping," or moving lower in your abdomen.  You may have a bloody mucus discharge. This usually occurs a few days to a week before labor begins.  Your cervix becomes thin and soft (effaced) near your due date. WHAT TO EXPECT AT YOUR PRENATAL  EXAMS  You will have prenatal exams every 2 weeks until week 36. Then, you will have weekly prenatal exams. During a routine prenatal visit:  You will be weighed to make sure you and the fetus are growing normally.  Your blood pressure is taken.  Your abdomen will be measured to track your baby's growth.  The fetal heartbeat will be listened to.  Any test results from the previous visit will be discussed.  You may have a cervical check near your due date to see if you have effaced. At around 36 weeks, your caregiver will check your cervix. At the same time, your caregiver will also perform a test on the secretions of the vaginal tissue. This test is to determine if a type of bacteria, Group B streptococcus, is present. Your caregiver will explain this further. Your caregiver may ask you:  What your birth plan is.  How you are feeling.  If you are feeling the baby move.  If you have had any abnormal symptoms, such as leaking fluid, bleeding, severe headaches, or abdominal cramping.  If you have any questions. Other tests or screenings that may be performed during your third trimester include:  Blood tests that check for low iron levels (anemia).  Fetal testing to check the health, activity level, and growth of the fetus. Testing is done if you have certain medical conditions or if there are problems during the pregnancy. FALSE LABOR You may feel small, irregular contractions that   eventually go away. These are called Braxton Hicks contractions, or false labor. Contractions may last for hours, days, or even weeks before true labor sets in. If contractions come at regular intervals, intensify, or become painful, it is best to be seen by your caregiver.  SIGNS OF LABOR   Menstrual-like cramps.  Contractions that are 5 minutes apart or less.  Contractions that start on the top of the uterus and spread down to the lower abdomen and back.  A sense of increased pelvic pressure or back  pain.  A watery or bloody mucus discharge that comes from the vagina. If you have any of these signs before the 37th week of pregnancy, call your caregiver right away. You need to go to the hospital to get checked immediately. HOME CARE INSTRUCTIONS   Avoid all smoking, herbs, alcohol, and unprescribed drugs. These chemicals affect the formation and growth of the baby.  Follow your caregiver's instructions regarding medicine use. There are medicines that are either safe or unsafe to take during pregnancy.  Exercise only as directed by your caregiver. Experiencing uterine cramps is a good sign to stop exercising.  Continue to eat regular, healthy meals.  Wear a good support bra for breast tenderness.  Do not use hot tubs, steam rooms, or saunas.  Wear your seat belt at all times when driving.  Avoid raw meat, uncooked cheese, cat litter boxes, and soil used by cats. These carry germs that can cause birth defects in the baby.  Take your prenatal vitamins.  Try taking a stool softener (if your caregiver approves) if you develop constipation. Eat more high-fiber foods, such as fresh vegetables or fruit and whole grains. Drink plenty of fluids to keep your urine clear or pale yellow.  Take warm sitz baths to soothe any pain or discomfort caused by hemorrhoids. Use hemorrhoid cream if your caregiver approves.  If you develop varicose veins, wear support hose. Elevate your feet for 15 minutes, 3-4 times a day. Limit salt in your diet.  Avoid heavy lifting, wear low heal shoes, and practice good posture.  Rest a lot with your legs elevated if you have leg cramps or low back pain.  Visit your dentist if you have not gone during your pregnancy. Use a soft toothbrush to brush your teeth and be gentle when you floss.  A sexual relationship may be continued unless your caregiver directs you otherwise.  Do not travel far distances unless it is absolutely necessary and only with the approval  of your caregiver.  Take prenatal classes to understand, practice, and ask questions about the labor and delivery.  Make a trial run to the hospital.  Pack your hospital bag.  Prepare the baby's nursery.  Continue to go to all your prenatal visits as directed by your caregiver. SEEK MEDICAL CARE IF:  You are unsure if you are in labor or if your water has broken.  You have dizziness.  You have mild pelvic cramps, pelvic pressure, or nagging pain in your abdominal area.  You have persistent nausea, vomiting, or diarrhea.  You have a bad smelling vaginal discharge.  You have pain with urination. SEEK IMMEDIATE MEDICAL CARE IF:   You have a fever.  You are leaking fluid from your vagina.  You have spotting or bleeding from your vagina.  You have severe abdominal cramping or pain.  You have rapid weight loss or gain.  You have shortness of breath with chest pain.  You notice sudden or extreme swelling   of your face, hands, ankles, feet, or legs.  You have not felt your baby move in over an hour.  You have severe headaches that do not go away with medicine.  You have vision changes. Document Released: 12/21/2000 Document Revised: 01/01/2013 Document Reviewed: 02/28/2012 [employer] Patient Information 2015 [employer]. This information is not intended to replace advice given to you by your health care provider. Make sure you discuss any questions you have with your health care provider.  

## 2014-10-13 ENCOUNTER — Other Ambulatory Visit: Payer: Self-pay | Admitting: Family Medicine

## 2014-10-13 ENCOUNTER — Ambulatory Visit (HOSPITAL_COMMUNITY)
Admission: RE | Admit: 2014-10-13 | Discharge: 2014-10-13 | Disposition: A | Discharge status: Home / Self Care | Payer: Medicaid Other | Source: Ambulatory Visit | Attending: Family Medicine | Admitting: Family Medicine

## 2014-10-13 DIAGNOSIS — O09293 Supervision of pregnancy with other poor reproductive or obstetric history, third trimester: Secondary | ICD-10-CM

## 2014-10-13 DIAGNOSIS — Z3A36 36 weeks gestation of pregnancy: Secondary | ICD-10-CM

## 2014-10-13 DIAGNOSIS — O09299 Supervision of pregnancy with other poor reproductive or obstetric history, unspecified trimester: Secondary | ICD-10-CM | POA: Diagnosis not present

## 2014-10-13 DIAGNOSIS — O0933 Supervision of pregnancy with insufficient antenatal care, third trimester: Secondary | ICD-10-CM | POA: Diagnosis not present

## 2014-10-13 DIAGNOSIS — O34219 Maternal care for unspecified type scar from previous cesarean delivery: Secondary | ICD-10-CM

## 2014-10-16 ENCOUNTER — Other Ambulatory Visit (HOSPITAL_COMMUNITY)
Admission: RE | Admit: 2014-10-16 | Discharge: 2014-10-16 | Disposition: A | Discharge status: Home / Self Care | Payer: Medicaid Other | Source: Ambulatory Visit | Attending: Obstetrics & Gynecology | Admitting: Obstetrics & Gynecology

## 2014-10-16 ENCOUNTER — Ambulatory Visit (INDEPENDENT_AMBULATORY_CARE_PROVIDER_SITE_OTHER): Payer: Medicaid Other | Admitting: Obstetrics & Gynecology

## 2014-10-16 VITALS — BP 101/66 | HR 65 | Wt 148.0 lb

## 2014-10-16 DIAGNOSIS — O09293 Supervision of pregnancy with other poor reproductive or obstetric history, third trimester: Secondary | ICD-10-CM

## 2014-10-16 DIAGNOSIS — O0933 Supervision of pregnancy with insufficient antenatal care, third trimester: Secondary | ICD-10-CM

## 2014-10-16 DIAGNOSIS — Z113 Encounter for screening for infections with a predominantly sexual mode of transmission: Secondary | ICD-10-CM | POA: Diagnosis present

## 2014-10-16 LAB — POCT URINALYSIS DIP (DEVICE)
Bilirubin Urine: NEGATIVE
GLUCOSE, UA: NEGATIVE mg/dL
Ketones, ur: NEGATIVE mg/dL
NITRITE: NEGATIVE
Protein, ur: NEGATIVE mg/dL
SPECIFIC GRAVITY, URINE: 1.025 (ref 1.005–1.030)
UROBILINOGEN UA: 0.2 mg/dL (ref 0.0–1.0)
pH: 6 (ref 5.0–8.0)

## 2014-10-16 NOTE — Patient Instructions (Signed)

## 2014-10-16 NOTE — Progress Notes (Signed)
NST reactive today,  Subjective:  Suzanne Mccoy is a 34 y.o. G5P4003 at [redacted]w[redacted]d being seen today for ongoing prenatal care.  Patient reports occasional contractions.  Contractions: Irritability.  Vag. Bleeding: None. Movement: Present. Denies leaking of fluid.   The following portions of the patient's history were reviewed and updated as appropriate: allergies, current medications, past family history, past medical history, past social history, past surgical history and problem list.   Objective:   Filed Vitals:   10/16/14 0838  BP: 101/66  Pulse: 65  Weight: 148 lb (67.132 kg)    Fetal Status:     Movement: Present     General:  Alert, oriented and cooperative. Patient is in no acute distress.  Skin: Skin is warm and dry. No rash noted.   Cardiovascular: Normal heart rate noted  Respiratory: Normal respiratory effort, no problems with respiration noted  Abdomen: Soft, gravid, appropriate for gestational age. Pain/Pressure: Present     Pelvic: Vag. Bleeding: None     Cervical exam performed        Extremities: Normal range of motion.  Edema: Trace  Mental Status: Normal mood and affect. Normal behavior. Normal judgment and thought content.   Urinalysis:      Assessment and Plan:  Pregnancy: G5P4003 at [redacted]w[redacted]d  1. Hx of intrauterine fetal death, currently pregnant, third trimester NST reactive - Fetal nonstress test - Culture, beta strep (group b only) - GC/Chlamydia probe amp ([place])not at [facility]  2. Late prenatal care affecting pregnancy in third trimester, antepartum H/o feta demise  Term labor symptoms and general obstetric precautions including but not limited to vaginal bleeding, contractions, leaking of fluid and fetal movement were reviewed in detail with the patient. Please refer to After Visit Summary for other counseling recommendations.  Return in about 1 day (around 10/17/2014).   Adam Phenix, MD

## 2014-10-16 NOTE — Progress Notes (Signed)
Alona Bene used for interpreter  Patient reports lots of pressure and pain when baby moves

## 2014-10-17 LAB — GC/CHLAMYDIA PROBE AMP (~~LOC~~) NOT AT ARMC
Chlamydia: NEGATIVE
NEISSERIA GONORRHEA: NEGATIVE

## 2014-10-18 ENCOUNTER — Inpatient Hospital Stay (HOSPITAL_COMMUNITY)
Admission: AD | Admit: 2014-10-18 | Discharge: 2014-10-20 | DRG: 775 | Disposition: A | Discharge status: Home / Self Care | Payer: Medicaid Other | Source: Ambulatory Visit | Attending: Obstetrics and Gynecology | Admitting: Obstetrics and Gynecology

## 2014-10-18 ENCOUNTER — Encounter (HOSPITAL_COMMUNITY): Payer: Self-pay | Admitting: *Deleted

## 2014-10-18 DIAGNOSIS — O34219 Maternal care for unspecified type scar from previous cesarean delivery: Secondary | ICD-10-CM

## 2014-10-18 DIAGNOSIS — O42913 Preterm premature rupture of membranes, unspecified as to length of time between rupture and onset of labor, third trimester: Secondary | ICD-10-CM

## 2014-10-18 DIAGNOSIS — O0933 Supervision of pregnancy with insufficient antenatal care, third trimester: Secondary | ICD-10-CM

## 2014-10-18 DIAGNOSIS — O1494 Unspecified pre-eclampsia, complicating childbirth: Secondary | ICD-10-CM | POA: Diagnosis present

## 2014-10-18 DIAGNOSIS — Z3A36 36 weeks gestation of pregnancy: Secondary | ICD-10-CM

## 2014-10-18 DIAGNOSIS — O4290 Premature rupture of membranes, unspecified as to length of time between rupture and onset of labor, unspecified weeks of gestation: Secondary | ICD-10-CM

## 2014-10-18 LAB — CBC
HCT: 32.3 % — ABNORMAL LOW (ref 36.0–46.0)
Hemoglobin: 10.5 g/dL — ABNORMAL LOW (ref 12.0–15.0)
MCH: 26.3 pg (ref 26.0–34.0)
MCHC: 32.5 g/dL (ref 30.0–36.0)
MCV: 81 fL (ref 78.0–100.0)
PLATELETS: 142 10*3/uL — AB (ref 150–400)
RBC: 3.99 MIL/uL (ref 3.87–5.11)
RDW: 13.8 % (ref 11.5–15.5)
WBC: 8.3 10*3/uL (ref 4.0–10.5)

## 2014-10-18 LAB — GROUP B STREP BY PCR: GROUP B STREP BY PCR: NEGATIVE

## 2014-10-18 LAB — TYPE AND SCREEN
ABO/RH(D): O POS
ANTIBODY SCREEN: NEGATIVE

## 2014-10-18 LAB — POCT FERN TEST: POCT Fern Test: POSITIVE

## 2014-10-18 LAB — RPR: RPR Ser Ql: NONREACTIVE

## 2014-10-18 LAB — OB RESULTS CONSOLE GBS: STREP GROUP B AG: NEGATIVE

## 2014-10-18 LAB — ABO/RH: ABO/RH(D): O POS

## 2014-10-18 MED ORDER — PENICILLIN G POTASSIUM 5000000 UNITS IJ SOLR
5.0000 10*6.[IU] | Freq: Once | INTRAVENOUS | Status: DC
  Filled 2014-10-18: qty 5

## 2014-10-18 MED ORDER — BETAMETHASONE SOD PHOS & ACET 6 (3-3) MG/ML IJ SUSP
12.0000 mg | Freq: Once | INTRAMUSCULAR | Status: AC
  Administered 2014-10-18: 12 mg via INTRAMUSCULAR
  Filled 2014-10-18: qty 2

## 2014-10-18 MED ORDER — ACETAMINOPHEN 325 MG PO TABS: 650.0000 mg | ORAL_TABLET | ORAL | Status: DC | PRN

## 2014-10-18 MED ORDER — LACTATED RINGERS IV SOLN
500.0000 mL | INTRAVENOUS | Status: DC | PRN
  Administered 2014-10-18: 500 mL via INTRAVENOUS

## 2014-10-18 MED ORDER — DIBUCAINE 1 % RE OINT: 1.0000 "application " | TOPICAL_OINTMENT | RECTAL | Status: DC | PRN

## 2014-10-18 MED ORDER — ONDANSETRON HCL 4 MG/2ML IJ SOLN: 4.0000 mg | INTRAMUSCULAR | Status: DC | PRN

## 2014-10-18 MED ORDER — PENICILLIN G POTASSIUM 5000000 UNITS IJ SOLR
2.5000 10*6.[IU] | INTRAVENOUS | Status: DC
  Filled 2014-10-18 (×2): qty 2.5

## 2014-10-18 MED ORDER — OXYTOCIN 40 UNITS IN LACTATED RINGERS INFUSION - SIMPLE MED
1.0000 m[IU]/min | INTRAVENOUS | Status: DC
  Administered 2014-10-18: 2 m[IU]/min via INTRAVENOUS
  Filled 2014-10-18: qty 1000

## 2014-10-18 MED ORDER — OXYCODONE-ACETAMINOPHEN 5-325 MG PO TABS: 2.0000 | ORAL_TABLET | ORAL | Status: DC | PRN

## 2014-10-18 MED ORDER — ONDANSETRON HCL 4 MG/2ML IJ SOLN: 4.0000 mg | Freq: Four times a day (QID) | INTRAMUSCULAR | Status: DC | PRN

## 2014-10-18 MED ORDER — WITCH HAZEL-GLYCERIN EX PADS: 1.0000 "application " | MEDICATED_PAD | CUTANEOUS | Status: DC | PRN

## 2014-10-18 MED ORDER — CITRIC ACID-SODIUM CITRATE 334-500 MG/5ML PO SOLN: 30.0000 mL | ORAL | Status: DC | PRN

## 2014-10-18 MED ORDER — TETANUS-DIPHTH-ACELL PERTUSSIS 5-2.5-18.5 LF-MCG/0.5 IM SUSP: 0.5000 mL | Freq: Once | INTRAMUSCULAR | Status: DC

## 2014-10-18 MED ORDER — OXYTOCIN BOLUS FROM INFUSION: 500.0000 mL | Freq: Once | INTRAVENOUS | Status: DC | PRN

## 2014-10-18 MED ORDER — PRENATAL MULTIVITAMIN CH
1.0000 | ORAL_TABLET | Freq: Every day | ORAL | Status: DC
  Administered 2014-10-19 – 2014-10-20 (×2): 1 via ORAL
  Filled 2014-10-18 (×2): qty 1

## 2014-10-18 MED ORDER — FENTANYL CITRATE (PF) 100 MCG/2ML IJ SOLN: 50.0000 ug | INTRAMUSCULAR | Status: DC | PRN

## 2014-10-18 MED ORDER — OXYCODONE-ACETAMINOPHEN 5-325 MG PO TABS: 1.0000 | ORAL_TABLET | ORAL | Status: DC | PRN

## 2014-10-18 MED ORDER — ZOLPIDEM TARTRATE 5 MG PO TABS
5.0000 mg | ORAL_TABLET | Freq: Every evening | ORAL | Status: DC | PRN
Start: 2014-10-18 — End: 2014-10-20

## 2014-10-18 MED ORDER — ONDANSETRON HCL 4 MG PO TABS
4.0000 mg | ORAL_TABLET | ORAL | Status: DC | PRN
Start: 2014-10-18 — End: 2014-10-20

## 2014-10-18 MED ORDER — IBUPROFEN 600 MG PO TABS
600.0000 mg | ORAL_TABLET | Freq: Four times a day (QID) | ORAL | Status: DC
  Administered 2014-10-18 – 2014-10-20 (×7): 600 mg via ORAL
  Filled 2014-10-18 (×7): qty 1

## 2014-10-18 MED ORDER — SENNOSIDES-DOCUSATE SODIUM 8.6-50 MG PO TABS
2.0000 | ORAL_TABLET | ORAL | Status: DC
  Administered 2014-10-19: 2 via ORAL
  Filled 2014-10-18: qty 2

## 2014-10-18 MED ORDER — OXYTOCIN 40 UNITS IN LACTATED RINGERS INFUSION - SIMPLE MED
62.5000 mL/h | Freq: Once | INTRAVENOUS | Status: DC | PRN
  Administered 2014-10-18: 62.5 mL/h via INTRAVENOUS

## 2014-10-18 MED ORDER — SIMETHICONE 80 MG PO CHEW: 80.0000 mg | CHEWABLE_TABLET | ORAL | Status: DC | PRN

## 2014-10-18 MED ORDER — OXYCODONE-ACETAMINOPHEN 5-325 MG PO TABS
1.0000 | ORAL_TABLET | ORAL | Status: DC | PRN
  Administered 2014-10-19: 1 via ORAL
  Filled 2014-10-18: qty 1

## 2014-10-18 MED ORDER — LANOLIN HYDROUS EX OINT: TOPICAL_OINTMENT | CUTANEOUS | Status: DC | PRN

## 2014-10-18 MED ORDER — BENZOCAINE-MENTHOL 20-0.5 % EX AERO
1.0000 "application " | INHALATION_SPRAY | CUTANEOUS | Status: DC | PRN
  Filled 2014-10-18: qty 56

## 2014-10-18 MED ORDER — LACTATED RINGERS IV SOLN
INTRAVENOUS | Status: DC
  Administered 2014-10-18: 125 mL/h via INTRAVENOUS
  Administered 2014-10-18: 08:00:00 via INTRAVENOUS

## 2014-10-18 MED ORDER — LIDOCAINE HCL (PF) 1 % IJ SOLN
30.0000 mL | INTRAMUSCULAR | Status: DC | PRN
  Filled 2014-10-18: qty 30

## 2014-10-18 MED ORDER — TERBUTALINE SULFATE 1 MG/ML IJ SOLN: 0.2500 mg | Freq: Once | INTRAMUSCULAR | Status: DC | PRN

## 2014-10-18 MED ORDER — DIPHENHYDRAMINE HCL 25 MG PO CAPS: 25.0000 mg | ORAL_CAPSULE | Freq: Four times a day (QID) | ORAL | Status: DC | PRN

## 2014-10-18 NOTE — Progress Notes (Signed)
Pt unable to void on bedside commode

## 2014-10-18 NOTE — Progress Notes (Signed)
Language line accessed to interview pt for admission information

## 2014-10-18 NOTE — H&P (Signed)
LABOR ADMISSION HISTORY AND PHYSICAL  Suzanne Mccoy is a 34 y.o. female G5P4003 with IUP at [redacted]w[redacted]d by LMP presenting for PROM around 0200. OB history significant for c-section for breech and IUFD at 40w. Planning for TOLAC. She reports +FM, + contractions, No LOF, no VB, no blurry vision, headaches or peripheral edema, and RUQ pain.  She plans on bottle feeding. She is undecided for birth control.  Dating: By LMP --->  Estimated Date of Delivery: 11/10/14  Sono:    @ [redacted]w[redacted]d, CWD, normal anatomy, cephalic presentation, transverse lie, 2586g, 78% EFW  Clinic [facility] Prenatal Labs  Dating LMP Blood type:   O pos  Genetic Screen Too late Antibody: Neg  Anatomic [place]  female, 78th%, BPP 8/8, posterior placenta with no previa Rubella:  Immune  GTT  Third trimester:86  RPR:   NR  Flu vaccine 09/24/14 HBsAg:   Neg  TDaP vaccine 09/24/14                                   HIV:   NR  GBS  Negative 10/18/14                                  GBS: Neg  Contraception  undecided Pap: needed after delivery  week visit  Baby Food  formula, due to "low supply"   Circumcision  desired   Pediatrician  Does not have yet   Support Person  FOB    Prenatal History/Complications:  Past Medical History: Past Medical History  Diagnosis Date  . Medical history non-contributory     Past Surgical History: Past Surgical History  Procedure Laterality Date  . Cesarean section      Obstetrical History: OB History    Gravida Para Term Preterm AB TAB SAB Ectopic Multiple Living   Social History: Social History   Social History  . Marital Status: Married    Spouse Name: N/A  . Number of Children: N/A  . Years of Education: N/A   Social History Main Topics  . Smoking status: Never Smoker   . Smokeless tobacco: None  . Alcohol Use: No  . Drug Use: No  . Sexual Activity: Yes    Birth Control/ Protection: None   Other Topics Concern  . None   Social History Narrative    Family  History: History reviewed. No pertinent family history.  Allergies: No Known Allergies  Prescriptions prior to admission  Medication Sig Dispense Refill Last Dose  . Prenatal Multivit-Min-Fe-FA (PRENATAL VITAMINS) 0.8 MG tablet Take 1 tablet by mouth daily. 30 tablet 12 10/17/2014 at Unknown time    Review of Systems   All systems reviewed and negative except as stated in HPI  BP 86/48 mmHg  Pulse 81  Temp(Src) 98.4 F (36.9 C) (Oral)  Resp 16  Ht 5' 2.5" (1.588 m)  Wt 68.493 kg (151 lb)  BMI 27.16 kg/m2  LMP 02/03/2014 General appearance: alert, cooperative and appears stated age Lungs: clear to auscultation bilaterally Heart: regular rate and rhythm Abdomen: soft, non-tender Extremities: no sign of DVT, edema Presentation: cephalic Fetal monitoringBaseline: 135 bpm, Variability: Fair (1-6 bpm), Accelerations: Reactive and Decelerations: Absent Uterine activityFrequency: Every 5 minutes, Duration: 60 seconds and Intensity: mild Pelvic: Dilation: 2 Effacement (%): Thick Exam  by:Ivonne Andrew CNM   Prenatal labs: ABO, Rh: --/--/O POS (10/08 0706) Antibody: NEG (10/08 0706) Rubella:   RPR: NON REAC October 05, 2022 4540)  HBsAg: NEGATIVE 10-05-22 0909)  HIV: NONREACTIVE 05-Oct-2022 0909)  GBS:  Neg  1 hr Glucola 86 Genetic screening Too late Anatomy US nml  Prenatal Transfer Tool  Maternal Diabetes: No Genetic Screening: Too late for screening Maternal Ultrasounds/Referrals: Normal Fetal Ultrasounds or other Referrals:  Referred to Materal Fetal Medicine , Other: BPP with NST Maternal Substance Abuse:  No Significant Maternal Medications:  None Significant Maternal Lab Results: Lab values include: Group B Strep negative  Results for orders placed or performed during the hospital encounter of 10/18/14 (from the past 24 hour(s))  Fern Test   Collection Time: 10/18/14  6:55 AM  Result Value Ref Range   POCT Fern Test Positive = ruptured amniotic membanes   CBC   Collection  Time: 10/18/14  7:06 AM  Result Value Ref Range   WBC 8.3 4.0 - 10.5 K/uL   RBC 3.99 3.87 - 5.11 MIL/uL   Hemoglobin 10.5 (L) 12.0 - 15.0 g/dL   HCT 98.1 (L) 19.1 - 47.8 %   MCV 81.0 78.0 - 100.0 fL   MCH 26.3 26.0 - 34.0 pg   MCHC 32.5 30.0 - 36.0 g/dL   RDW 29.5 62.1 - 30.8 %   Platelets 142 (L) 150 - 400 K/uL  Type and screen   Collection Time: 10/18/14  7:06 AM  Result Value Ref Range   ABO/RH(D) O POS    Antibody Screen NEG    Sample Expiration 10/21/2014   Group B strep by PCR   Collection Time: 10/18/14  7:20 AM  Result Value Ref Range   Group B strep by PCR NEGATIVE NEGATIVE    Patient Active Problem List   Diagnosis Date Noted  . Labor and delivery, indication for care 10/18/2014  . Supervision of other high-risk pregnancy October 05, 2014  . Hx of intrauterine fetal death, currently pregnant 10-05-14  . Late prenatal care affecting pregnancy in third trimester, antepartum 10/05/14  . Previous cesarean delivery affecting pregnancy, antepartum 2014-10-05  . Language barrier affecting health care 10-05-2014    Assessment: Suzanne Mccoy is a 34 y.o. G5P4003 at [redacted]w[redacted]d here after SROM for TOLAC.   #Labor: Anticipatory management #Pain: Does not want epidural, counseled to ask for fentanyl #FWB: Cat II #ID:  GBS neg #MOF: Bottle #MOC: Undecided #Circ:  Outpatient  Dani Gobble, MD [facility], PGY-1

## 2014-10-18 NOTE — MAU Note (Signed)
Woke up at 0200 and was leaking fld. Clear at first and then brown. No pain.

## 2014-10-18 NOTE — Progress Notes (Signed)
Ivonne Andrew CNM on unit and aware of pt's admission and status and efm strip. Aware pos Crist Fat

## 2014-10-18 NOTE — Progress Notes (Signed)
Suzanne Mccoy is a 34 y.o. G5P4003 at [redacted]w[redacted]d by  admitted for PROM  Subjective: Pt is contracting but contractions have not increased in strength.  Objective: BP 86/48 mmHg  Pulse 81  Temp(Src) 98.4 F (36.9 C) (Oral)  Resp 16  Ht 5' 2.5" (1.588 m)  Wt 68.493 kg (151 lb)  BMI 27.16 kg/m2  LMP 02/03/2014      FHT:  Cat 1  UC:   regular, every 3 minutes SVE:   Dilation: 2 Effacement (%): Thick Station: -3 Exam by:: Joleen Stuckert  Labs: Lab Results  Component Value Date   WBC 8.3 10/18/2014   HGB 10.5* 10/18/2014   HCT 32.3* 10/18/2014   MCV 81.0 10/18/2014   PLT 142* 10/18/2014    Assessment / Plan: Augment   Labor: with Pitocin Preeclampsia:   Fetal Wellbeing:  Category I Pain Control:  Epidural I/D:  gbs neg Anticipated MOD:  NSVD Betamethasone 12 mg IM  Kehinde Bowdish Grissett 10/18/2014, 11:38 AM

## 2014-10-18 NOTE — Progress Notes (Signed)
Husband in room.  RN used husband to explain Pitocin augmentation and function of receiving betamethosome.

## 2014-10-19 LAB — CULTURE, BETA STREP (GROUP B ONLY)

## 2014-10-19 NOTE — Progress Notes (Signed)
Post Partum Day 1 Subjective: no complaints, up ad lib, voiding and tolerating PO  Objective: Blood pressure 100/45, pulse 97, temperature 98.5 F (36.9 C), temperature source Oral, resp. rate 16, height 5' 2.5" (1.588 m), weight 151 lb (68.493 kg), last menstrual period 02/03/2014, unknown if currently breastfeeding.  Physical Exam:  General: alert, cooperative, appears stated age and no distress Lochia: appropriate Uterine Fundus: firm Incision: n/a DVT Evaluation: No evidence of DVT seen on physical exam. Negative Homan's sign. No cords or calf tenderness.   Recent Labs  10/18/14 0706  HGB 10.5*  HCT 32.3*    Assessment/Plan: Plan for discharge tomorrow   LOS: 1 day   Mordechai Matuszak DARLENE 10/19/2014, 8:48 AM

## 2014-10-19 NOTE — Clinical Social Work Maternal (Signed)
  CLINICAL SOCIAL WORK MATERNAL/CHILD NOTE  Patient Details  Name: Suzanne Mccoy MRN: [id number] Date of Birth: 07/03/1980  Date:  10/19/2014  Clinical Social Worker Initiating Note:  Norlene Duel, LCSW Date/ Time Initiated:  10/19/14/1330     Child's Name:  Suzanne Mccoy   Legal Guardian:   (parents)   Need for Interpreter:  None   Date of Referral:  10/18/14     Reason for Referral:  Other (Comment)   Referral Source:  [facility]   Address:  [address]  [place], [place] [address]  Phone number:   [phone])   Household Members:  Minor Children, Spouse   Natural Supports (not living in the home):  Extended Family   Professional Supports:  ([place]  [phone])   Employment:     Type of Work:     Education:   (Spouse has a degree in Press photographer)   Financial Resources:  Medicaid   Other Resources:   (Provided information on the [employer] program)   Cultural/Religious Considerations Which May Impact Care:  none noted  Strengths:  Ability to meet basic needs , Compliance with medical plan  (FOB to request assistance with a car seat if needed)   Risk Factors/Current Problems:      Cognitive State:  Alert , Able to Concentrate    Mood/Affect:  Happy    CSW Assessment:  Acknowledged order for social work consult.  Informed that parents are refugees from [place].  Met with both parents.  Informed that they moved to [place] one month ago.  Spouse states that the family lived in [place] for 6 months before moving to [place].  They have 3 other dependents ages 11, 27, and 64.  Informed that [employer] ([place] have been providing financial support to them.  FOB states that he has a degree in community development and have been looking for employment.   He does not have a car seat and was given information on the [facility] car seat program.  He plans to reach out to the [employer] [place] for assistance with obtaining a pack & play.    Mother  reports no history of substance abuse or mental illness.  Parents informed of CSW availability.      CSW Plan/Description:     No further intervention required No barriers to discharge    Kannon Baum J, LCSW 10/19/2014, 4:27 PM

## 2014-10-19 NOTE — Lactation Note (Signed)
This note was copied from the chart of Suzanne Mccoy. Lactation Consultation Note  Mom has been in [place] for 1 month, she is not active with WIC and has no means to rent a pump at D/C. Parents are unemployed at this time. Will need to reevaluate at D/C  Patient Name: Suzanne Taylen Wendland ZOXWR'U Date: 10/19/2014 Reason for consult: Follow-up assessment;Infant < 6lbs;Late preterm infant   Maternal Data Formula Feeding for Exclusion: No Does the patient have breastfeeding experience prior to this delivery?: Yes  Feeding Feeding Type: Breast Fed Length of feed: 15 min  LATCH Score/Interventions Latch: Grasps breast easily, tongue down, lips flanged, rhythmical sucking. Intervention(s): Adjust position;Assist with latch;Breast massage;Breast compression  Audible Swallowing: Spontaneous and intermittent  Type of Nipple: Everted at rest and after stimulation  Comfort (Breast/Nipple): Soft / non-tender     Hold (Positioning): Assistance needed to correctly position infant at breast and maintain latch. Intervention(s): Breastfeeding basics reviewed;Support Pillows;Position options;Skin to skin  LATCH Score: 9  Lactation Tools Discussed/Used WIC Program: No   Consult Status Consult Status: Follow-up Date: 10/20/14 Follow-up type: In-patient    Silas Flood Hice 10/19/2014, 3:56 PM

## 2014-10-19 NOTE — Lactation Note (Signed)
This note was copied from the chart of Suzanne Mccoy. Lactation Consultation Note  Patient Name: Suzanne Kelise Kuch ZOXWR'U Date: 10/19/2014 Reason for consult: Follow-up assessment;Infant < 6lbs;Late preterm infant   Follow up with mom and infant for 53 hour old [redacted]w[redacted]d GA infant. Mom has BF 4 other children for 2 years each and denies having issues with BF. Infant has had 4 BF for 10-20 minutes, 5 bottle feedings of 5-10cc, 3 voids, 3 stools in last 24 hours. Moms friend was in room and served as Equities trader. Infant was sleepy when I went into the room, mom was attempting to feed infant at breast without success. Assisted mom with use of DEBP, approximately 1cc colostrum received. Infant then awoke to feed, diaper changed. He latched easily to right breast with lips flanged and rhythmic continuous sucking with few intermittent audible swallows. Assisted mom with using pillow support and using positioning to provide breathing space. Advised mom to give EBM via curved tip syringe post BF followed by Formula supplement via bottle. Gave mom LPT infant policy and informed her she needs to increase supplement amount per policy at 6:30 pm. Plan conveyed to mom via interpreter to attempt to BF at least Q 3 hours or sooner if infant wants, followed by supplementation of EBM first and then followed by Alimentum per LPTI protocol. Advised mom to pump breast for 15 minutes Q2-3 hours with DEBP followed by hand expression. Mom voiced understanding of all teaching. Plan is to follow up tomorrow inpatient.    Maternal Data Formula Feeding for Exclusion: No Does the patient have breastfeeding experience prior to this delivery?: Yes  Feeding Feeding Type: Breast Fed Length of feed: 15 min  LATCH Score/Interventions Latch: Grasps breast easily, tongue down, lips flanged, rhythmical sucking. Intervention(s): Adjust position;Assist with latch;Breast massage;Breast compression  Audible Swallowing:  Spontaneous and intermittent  Type of Nipple: Everted at rest and after stimulation  Comfort (Breast/Nipple): Soft / non-tender     Hold (Positioning): Assistance needed to correctly position infant at breast and maintain latch. Intervention(s): Breastfeeding basics reviewed;Support Pillows;Position options;Skin to skin  LATCH Score: 9  Lactation Tools Discussed/Used WIC Program: No   Consult Status Consult Status: Follow-up Date: 10/20/14 Follow-up type: In-patient    Silas Flood Shakti Fleer 10/19/2014, 3:40 PM

## 2014-10-20 ENCOUNTER — Other Ambulatory Visit: Payer: Medicaid Other

## 2014-10-20 ENCOUNTER — Ambulatory Visit: Payer: Self-pay

## 2014-10-20 MED ORDER — IBUPROFEN 600 MG PO TABS: 600.0000 mg | ORAL_TABLET | Freq: Four times a day (QID) | ORAL | Status: AC

## 2014-10-20 NOTE — Discharge Summary (Signed)
OB Discharge Summary  Patient Name: Suzanne Mccoy DOB: 1980/03/23 MRN: [id number]  Date of admission: 10/18/2014 Delivering MD: Casey Burkitt   Date of discharge: 10/20/2014  Admitting diagnosis: 9months, pain, bleeding, contractions Intrauterine pregnancy: [redacted]w[redacted]d     Secondary diagnosis: None     Discharge diagnosis: Preterm Pregnancy Delivered                                                                                                Post partum procedures:none  Augmentation: Pitocin  Complications: None  Hospital course:  Onset of Labor With Vaginal Delivery     34 y.o. yo [id number] at [redacted]w[redacted]d was admitted in Active Laboron 10/18/2014. Patient had an uncomplicated labor course as follows:  Membrane Rupture Time/Date: 2:00 AM ,10/18/2014   Intrapartum Procedures: Episiotomy: None [1]                                         Lacerations:  None [1]  Patient had a delivery of a Viable infant. 10/18/2014  Information for the patient's newborn:  Rahi, Chandonnet [[id number]]  Delivery Method: Vag-Spont    Pateint had an uncomplicated postpartum course.  She is ambulating, tolerating a regular diet, passing flatus, and urinating well. Patient is discharged home in stable condition on No discharge date for patient encounter.Marland Kitchen    Physical exam  Filed Vitals:   10/18/14 2025 10/18/14 2125 10/19/14 0130 10/19/14 1701  BP: 109/55 113/55 100/45 100/44  Pulse: 61 97 97 83  Temp: 98.1 F (36.7 C) 99.6 F (37.6 C) 98.5 F (36.9 C) 98.4 F (36.9 C)  TempSrc: Oral Oral Oral   Resp: Height:      Weight:       General: alert, cooperative and no distress Lochia: appropriate Uterine Fundus: firm Incision: N/A DVT Evaluation: No evidence of DVT seen on physical exam. Negative Homan's sign. No cords or calf tenderness. Labs: Lab Results  Component Value Date   WBC 8.3 10/18/2014   HGB 10.5* 10/18/2014   HCT 32.3* 10/18/2014   MCV 81.0  10/18/2014   PLT 142* 10/18/2014   No flowsheet data found.  Discharge instruction: per After Visit Summary and "Baby and Me Booklet".  Medications:  Current facility-administered medications:  .  acetaminophen (TYLENOL) tablet 650 mg, 650 mg, Oral, Q4H PRN, Casey Burkitt, MD .  benzocaine-Menthol (DERMOPLAST) 20-0.5 % topical spray 1 application, 1 application, Topical, PRN, Casey Burkitt, MD .  witch hazel-glycerin (TUCKS) pad 1 application, 1 application, Topical, PRN **AND** dibucaine (NUPERCAINAL) 1 % rectal ointment 1 application, 1 application, Rectal, PRN, Casey Burkitt, MD .  diphenhydrAMINE (BENADRYL) capsule 25 mg, 25 mg, Oral, Q6H PRN, Casey Burkitt, MD .  ibuprofen (ADVIL,MOTRIN) tablet 600 mg, 600 mg, Oral, 4 times per day, Casey Burkitt, MD, 600 mg at 10/19/14 2318 .  lanolin ointment, , Topical, PRN, Casey Burkitt, MD .  ondansetron [facility]) tablet 4 mg, 4  mg, Oral, Q4H PRN **OR** ondansetron (ZOFRAN) injection 4 mg, 4 mg, Intravenous, Q4H PRN, Casey Burkitt, MD .  oxyCODONE-acetaminophen (PERCOCET/ROXICET) 5-325 MG per tablet 1 tablet, 1 tablet, Oral, Q4H PRN, Casey Burkitt, MD, 1 tablet at 10/19/14 1507 .  oxyCODONE-acetaminophen (PERCOCET/ROXICET) 5-325 MG per tablet 2 tablet, 2 tablet, Oral, Q4H PRN, Casey Burkitt, MD .  prenatal multivitamin tablet 1 tablet, 1 tablet, Oral, Q1200, Casey Burkitt, MD, 1 tablet at 10/19/14 1216 .  senna-docusate (Senokot-S) tablet 2 tablet, 2 tablet, Oral, Q24H, Casey Burkitt, MD, 2 tablet at 10/19/14 2318 .  simethicone (MYLICON) chewable tablet 80 mg, 80 mg, Oral, PRN, Casey Burkitt, MD .  Tdap (BOOSTRIX) injection 0.5 mL, 0.5 mL, Intramuscular, Once, Casey Burkitt, MD .  zolpidem [facility]) tablet 5 mg, 5 mg, Oral, QHS PRN, Casey Burkitt, MD  Diet: routine diet  Activity: Advance as tolerated. Pelvic  rest for 6 weeks.   Outpatient follow up:6 weeks  Postpartum contraception: Undecided  Newborn Data: Live born female  Birth Weight: 7 lb 0.9 oz (3200 g) APGAR: 8, 9  Baby Feeding: Breast Disposition:home with mother   10/20/2014 Ferdie Ping, CNM

## 2014-10-20 NOTE — Lactation Note (Signed)
This note was copied from the chart of Suzanne Mccoy. Lactation Consultation Note  Patient Name: Suzanne Mccoy ZOXWR'U Date: 10/20/2014 Reason for consult: Late preterm infant;Follow-up assessment FOB reports that have an appointment with [facility] tomorrow. Declined WIC loaner pump rental today. Mom given Harmony Hand Pump and demonstrated how to use/clean to use to post pump to encourage/protect milk supply and to have EBM to supplement after feedings. Encouraged supplement of 20 ml today with each feeding.  Baby asleep at this visit, parents ready to go home. Encouraged to call for questions/concerns.   Maternal Data    Feeding    LATCH Score/Interventions                      Lactation Tools Discussed/Used Tools: Pump Breast pump type: Double-Electric Breast Pump WIC Program: Yes   Consult Status Consult Status: Complete Date: 10/20/14 Follow-up type: In-patient    Alfred Levins 10/20/2014, 1:55 PM

## 2014-10-20 NOTE — Lactation Note (Signed)
This note was copied from the chart of Suzanne Mccoy. Lactation Consultation Note  Patient Name: Suzanne Mccoy ONGEX'B Date: 10/20/2014 Reason for consult: Follow-up assessment FOB present to interpret. Reported to [facility] baby is nursing 10-minutes both breasts each feeding. Mom is supplementing with formula with feedings via bottle but not pumping consistently. Encouraged Mom to BF each feeding increasing to 15 minutes each breast. Encouraged FOB to give supplement via bottle, continue with 10-20 ml today according to LPT policy. Advised FOB this wlll allow Mom time to post pump every 3 hours. Mom pumping during my visit and receiving approx 10+. Advised parents to give EBM as supplement using formula as needed to meet volume parameters. Parents do not have WIC, Gave WIC BF hotline number to call to set up appointment and faxed [facility] referral to [facility] office. Left LC phone number for Mom to call with next feeding to observe baby at the breast. Mom is experienced BF but 1st time have LPT infant. Discussed difference with feeding LPT vis term baby. Reviewed EBM storage guidelines in Baby N Me booklet, page 25.   Maternal Data    Feeding Feeding Type: Formula Length of feed: 30 min  LATCH Score/Interventions                      Lactation Tools Discussed/Used Tools: Pump Breast pump type: Double-Electric Breast Pump   Consult Status Consult Status: Follow-up Date: 10/20/14 Follow-up type: In-patient    Alfred Levins 10/20/2014, 10:05 AM

## 2014-10-23 ENCOUNTER — Other Ambulatory Visit: Payer: Medicaid Other

## 2014-10-27 ENCOUNTER — Other Ambulatory Visit: Payer: Self-pay | Admitting: Family Medicine

## 2014-10-27 DIAGNOSIS — O2230 Deep phlebothrombosis in pregnancy, unspecified trimester: Secondary | ICD-10-CM

## 2014-10-27 MED ORDER — ENOXAPARIN SODIUM 40 MG/0.4ML ~~LOC~~ SOLN: 40.0000 mg | SUBCUTANEOUS | Status: DC

## 2014-10-27 NOTE — Progress Notes (Signed)
Received call from [employer] nurse - pt had been treated for DVT in pregnancy - [place] coumad[place] throughout her pregnancy.  Had been told to stop coumadin 1 month prior to delivery: she had stopped in 9/4.  Delivered on 10/8.  Was diagnosed and treated in February.  Needs prophylaxis through next 5 weeks: lovenox 40mg  [place] daily prescribed.

## 2014-10-27 NOTE — H&P (Signed)
LABOR ADMISSION HISTORY AND PHYSICAL  Suzanne Mccoy is a 34 y.o. female G5P4003 with IUP at [redacted]w[redacted]d by LMP presenting for PROM around 0200. OB history significant for c-section for breech and IUFD at 40w. Planning for TOLAC. She reports +FM, + contractions, No LOF, no VB, no blurry vision, headaches or peripheral edema, and RUQ pain. She plans on bottle feeding. She is undecided for birth control.  Dating: By LMP ---> Estimated Date of Delivery: 11/10/14  Sono:   @ [redacted]w[redacted]d, CWD, normal anatomy, cephalic presentation, transverse lie, 2586g, 78% EFW  Clinic [facility] Prenatal Labs  Dating LMP Blood type: O pos  Genetic Screen Too late Antibody: Neg  Anatomic [place] female, 78th%, BPP 8/8, posterior placenta with no previa Rubella: Immune  GTT Third trimester:86  RPR: NR  Flu vaccine 09/24/14 HBsAg: Neg  TDaP vaccine 09/24/14  HIV: NR  GBS Negative 10/18/14  GBS: Neg  Contraception undecided Pap: needed after delivery  week visit  Baby Food formula, due to "low supply"   Circumcision desired   Pediatrician Does not have yet   Support Person FOB    Prenatal History/Complications:  Past Medical History: Past Medical History  Diagnosis Date  . Medical history non-contributory     Past Surgical History: Past Surgical History  Procedure Laterality Date  . Cesarean section      Obstetrical History: OB History    Gravida Para Term Preterm AB TAB SAB Ectopic Multiple Living   Social History: Social History   Social History  . Marital Status: Married    Spouse Name: N/A  . Number of Children: N/A  . Years of Education: N/A   Social History Main Topics  . Smoking status: Never Smoker   . Smokeless tobacco: None  . Alcohol Use: No  . Drug Use: No  . Sexual Activity:  Yes    Birth Control/ Protection: None   Other Topics Concern  . None   Social History Narrative    Family History: History reviewed. No pertinent family history.  Allergies: No Known Allergies  Prescriptions prior to admission  Medication Sig Dispense Refill Last Dose  . Prenatal Multivit-Min-Fe-FA (PRENATAL VITAMINS) 0.8 MG tablet Take 1 tablet by mouth daily. 30 tablet 12 10/17/2014 at Unknown time    Review of Systems   All systems reviewed and negative except as stated in HPI  BP 86/48 mmHg  Pulse 81  Temp(Src) 98.4 F (36.9 C) (Oral)  Resp 16  Ht 5' 2.5" (1.588 m)  Wt 68.493 kg (151 lb)  BMI 27.16 kg/m2  LMP 02/03/2014 General appearance: alert, cooperative and appears stated age Lungs: clear to auscultation bilaterally Heart: regular rate and rhythm Abdomen: soft, non-tender Extremities: no sign of DVT, edema Presentation: cephalic Fetal monitoringBaseline: 135 bpm, Variability: Fair (1-6 bpm), Accelerations: Reactive and Decelerations: Absent Uterine activityFrequency: Every 5 minutes, Duration: 60 seconds and Intensity: mild Pelvic: Dilation: 2 Effacement (%): Thick Exam by:: Ivonne Andrew CNM   Prenatal labs: ABO, Rh: --/--/O POS (10/08 0706) Antibody: NEG (10/08 0706) Rubella:   RPR: NON REAC (09/14 0909)  HBsAg: NEGATIVE (09/14 0909)  HIV: NONREACTIVE (09/14 0909)  GBS:  Neg  1 hr Glucola 86 Genetic screening Too late Anatomy US nml  Prenatal Transfer Tool  Maternal Diabetes: No Genetic Screening: Too late for screening Maternal Ultrasounds/Referrals: Normal Fetal Ultrasounds or other Referrals: Referred to Materal Fetal Medicine , Other: BPP with NST Maternal  Substance Abuse: No Significant Maternal Medications: None Significant Maternal Lab Results: Lab values include: Group B Strep negative  Results for orders placed or performed during the hospital encounter of 10/18/14 (from the past 24 hour(s))    Fern Test   Collection Time: 10/18/14 6:55 AM  Result Value Ref Range   POCT Fern Test Positive = ruptured amniotic membanes   CBC   Collection Time: 10/18/14 7:06 AM  Result Value Ref Range   WBC 8.3 4.0 - 10.5 K/uL   RBC 3.99 3.87 - 5.11 MIL/uL   Hemoglobin 10.5 (L) 12.0 - 15.0 g/dL   HCT 11.932.3 (L) 14.736.0 - 82.946.0 %   MCV 81.0 78.0 - 100.0 fL   MCH 26.3 26.0 - 34.0 pg   MCHC 32.5 30.0 - 36.0 g/dL   RDW 56.213.8 13.011.5 - 86.515.5 %   Platelets 142 (L) 150 - 400 K/uL  Type and screen   Collection Time: 10/18/14 7:06 AM  Result Value Ref Range   ABO/RH(D) O POS    Antibody Screen NEG    Sample Expiration 10/21/2014   Group B strep by PCR   Collection Time: 10/18/14 7:20 AM  Result Value Ref Range   Group B strep by PCR NEGATIVE NEGATIVE    Patient Active Problem List   Diagnosis Date Noted  . Labor and delivery, indication for care 10/18/2014  . Supervision of other high-risk pregnancy 09/24/2014  . Hx of intrauterine fetal death, currently pregnant 09/24/2014  . Late prenatal care affecting pregnancy in third trimester, antepartum 09/24/2014  . Previous cesarean delivery affecting pregnancy, antepartum 09/24/2014  . Language barrier affecting health care 09/24/2014    Assessment: Suzanne Mccoy is a 34 y.o. G5P4003 at 7713w5d here after SROM for TOLAC.   #Labor: Anticipatory management #Pain:Does not want epidural, counseled to ask for fentanyl #FWB:Cat II #ID: GBS neg #MOF: Bottle #MOC: Undecided #Circ: Outpatient  Dani GobbleHillary Fitzgerald, MD [facility], PGY-1  Illene BolusLori Clemmons CNM        Cosigned by: Rhea PinkLori A Clemmons, CNM at 10/20/2014 6:36 PM  Revision History     Date/Time User Provider Type Action   10/20/2014 6:36 PM Rhea PinkLori A Clemmons, CNM Certified Nurse Midwife Cosign   10/18/2014 3:15 PM Hillary Percell BostonMoen Fitzgerald, MD Resident Sign    10/18/2014 2:31 PM Hillary Percell BostonMoen Fitzgerald, MD Resident Sign   View Details Report       Routing History     Date/Time From To Method   10/20/2014 6:36 PM Rhea PinkLori A Clemmons, CNM Casey BurkittHillary Moen Fitzgerald, MD In [place]

## 2014-10-30 ENCOUNTER — Encounter (HOSPITAL_COMMUNITY): Payer: Self-pay | Admitting: *Deleted

## 2014-10-30 ENCOUNTER — Inpatient Hospital Stay (HOSPITAL_COMMUNITY)
Admission: AD | Admit: 2014-10-30 | Discharge: 2014-10-30 | Disposition: A | Discharge status: Home / Self Care | Payer: Medicaid Other | Source: Ambulatory Visit | Attending: Obstetrics & Gynecology | Admitting: Obstetrics & Gynecology

## 2014-10-30 DIAGNOSIS — L200 Besnier's prurigo: Secondary | ICD-10-CM | POA: Diagnosis not present

## 2014-10-30 DIAGNOSIS — L239 Allergic contact dermatitis, unspecified cause: Secondary | ICD-10-CM | POA: Diagnosis not present

## 2014-10-30 DIAGNOSIS — O9089 Other complications of the puerperium, not elsewhere classified: Secondary | ICD-10-CM | POA: Insufficient documentation

## 2014-10-30 DIAGNOSIS — L299 Pruritus, unspecified: Secondary | ICD-10-CM | POA: Diagnosis present

## 2014-10-30 LAB — COMPREHENSIVE METABOLIC PANEL
ALBUMIN: 3.4 g/dL — AB (ref 3.5–5.0)
ALK PHOS: 96 U/L (ref 38–126)
ALT: 22 U/L (ref 14–54)
ANION GAP: 5 (ref 5–15)
AST: 25 U/L (ref 15–41)
BUN: 24 mg/dL — ABNORMAL HIGH (ref 6–20)
CALCIUM: 9.1 mg/dL (ref 8.9–10.3)
CHLORIDE: 106 mmol/L (ref 101–111)
CO2: 27 mmol/L (ref 22–32)
Creatinine, Ser: 0.91 mg/dL (ref 0.44–1.00)
GFR calc Af Amer: >60 mL/min (ref >60)
GFR calc non Af Amer: >60 mL/min (ref >60)
GLUCOSE: 121 mg/dL — AB (ref 65–99)
POTASSIUM: 4 mmol/L (ref 3.5–5.1)
Sodium: 138 mmol/L (ref 135–145)
Total Bilirubin: 0.5 mg/dL (ref 0.3–1.2)
Total Protein: 7.3 g/dL (ref 6.5–8.1)

## 2014-10-30 LAB — CBC
HEMATOCRIT: 37.4 % (ref 36.0–46.0)
Hemoglobin: 11.9 g/dL — ABNORMAL LOW (ref 12.0–15.0)
MCH: 26.3 pg (ref 26.0–34.0)
MCHC: 31.8 g/dL (ref 30.0–36.0)
MCV: 82.7 fL (ref 78.0–100.0)
Platelets: 283 10*3/uL (ref 150–400)
RBC: 4.52 MIL/uL (ref 3.87–5.11)
RDW: 14.4 % (ref 11.5–15.5)
WBC: 9.5 10*3/uL (ref 4.0–10.5)

## 2014-10-30 MED ORDER — CETIRIZINE HCL 5 MG PO TABS: 5.0000 mg | ORAL_TABLET | Freq: Every day | ORAL | Status: DC

## 2014-10-30 NOTE — MAU Provider Note (Signed)
History     CSN: [id number]  Arrival date and time: 10/30/14 1235   First Provider Initiated Contact with Patient 10/30/14 1310      Chief Complaint  Patient presents with  . Pruritis   HPI Ms. Shashana Fullington is a 34 y.o. [id number] who had SVD on 10/18/14 who presents to MAU today with complaint of itching. The patient states that itching started last night and is all over her body. She has not taken anything to help with the itching. She denies any new medications or change in hygiene products. She also denies fever, SOB or swelling of the lips or tongue. She does endorse rash in a few areas, most prominent on the upper arms and legs.   OB History    Gravida Para Term Preterm AB TAB SAB Ectopic Multiple Living   0 4      Past Medical History  Diagnosis Date  . Medical history non-contributory     Past Surgical History  Procedure Laterality Date  . Cesarean section      History reviewed. No pertinent family history.  Social History  Substance Use Topics  . Smoking status: Never Smoker   . Smokeless tobacco: None  . Alcohol Use: No    Allergies: No Known Allergies  Prescriptions prior to admission  Medication Sig Dispense Refill Last Dose  . enoxaparin (LOVENOX) 40 MG/0.4ML injection Inject 0.4 mLs (40 mg total) into the skin daily. (Patient not taking: Reported on 10/30/2014) 35 Syringe 0   . ibuprofen (ADVIL,MOTRIN) 600 MG tablet Take 1 tablet (600 mg total) by mouth every 6 (six) hours. (Patient not taking: Reported on 10/30/2014) 30 tablet 0   . Prenatal Multivit-Min-Fe-FA (PRENATAL VITAMINS) 0.8 MG tablet Take 1 tablet by mouth daily. 30 tablet 12 10/28/2014    Review of Systems  Constitutional: Negative for fever and malaise/fatigue.  Respiratory: Negative for cough, shortness of breath and wheezing.   Skin: Positive for itching and rash.   Physical Exam   Blood pressure 110/60, pulse 89, temperature 98.5 F (36.9 C), resp. rate 18,  height 5' 2.5" (1.588 m), weight 143 lb (64.864 kg), currently breastfeeding.  Physical Exam  Nursing note and vitals reviewed. Constitutional: She is oriented to person, place, and time. She appears well-developed and well-nourished. No distress.  HENT:  Head: Normocephalic and atraumatic.  Cardiovascular: Normal rate.   Respiratory: Effort normal.  GI: Soft.  Neurological: She is alert and oriented to person, place, and time.  Skin: Skin is warm and dry. Rash (areas of pinpoint papular rash with mild erythema noted on the upper arms and thighs) noted. There is erythema.  Psychiatric: She has a normal mood and affect.   Results for orders placed or performed during the hospital encounter of 10/30/14 (from the past 24 hour(s))  CBC     Status: Abnormal   Collection Time: 10/30/14  1:50 PM  Result Value Ref Range   WBC 9.5 4.0 - 10.5 K/uL   RBC 4.52 3.87 - 5.11 MIL/uL   Hemoglobin 11.9 (L) 12.0 - 15.0 g/dL   HCT 14.7 82.9 - 56.2 %   MCV 82.7 78.0 - 100.0 fL   MCH 26.3 26.0 - 34.0 pg   MCHC 31.8 30.0 - 36.0 g/dL   RDW 13.0 86.5 - 78.4 %   Platelets 283 150 - 400 K/uL  Comprehensive metabolic panel     Status: Abnormal   Collection Time: 10/30/14  1:50 PM  Result Value Ref Range   Sodium 138 135 - 145 mmol/L   Potassium 4.0 3.5 - 5.1 mmol/L   Chloride 106 101 - 111 mmol/L   CO2 27 22 - 32 mmol/L   Glucose, Bld 121 (H) 65 - 99 mg/dL   BUN 24 (H) 6 - 20 mg/dL   Creatinine, Ser 1.610.91 0.44 - 1.00 mg/dL   Calcium 9.1 8.9 - 09.610.3 mg/dL   Total Protein 7.3 6.5 - 8.1 g/dL   Albumin 3.4 (L) 3.5 - 5.0 g/dL   AST 25 15 - 41 U/L   ALT 22 14 - 54 U/L   Alkaline Phosphatase 96 38 - 126 U/L   Total Bilirubin 0.5 0.3 - 1.2 mg/dL   GFR calc non Af Amer >60 >60 mL/min   GFR calc Af Amer >60 >60 mL/min   Anion gap 5 5 - 15    MAU Course  Procedures None  MDM CBC, CMP and Bile acids today Bile acids pending at time of discharge  Assessment and Plan  A: Allergic  dermatitis  P: Discharge home Rx for Zyrtec given to patient Warning signs for worsening condition discussed Patient advised that antihistamines may affect milk production Patient advised to follow-up with [facility]OC as scheduled for routine postpartum exam or sooner PRN Patient may return to MAU as needed or if her condition were to change or worsen  Marny LowensteinJulie N Smera Guyette, PA-C  10/30/2014, 2:47 PM

## 2014-10-30 NOTE — Discharge Instructions (Signed)

## 2014-10-30 NOTE — MAU Note (Signed)
Pt reports she started itching  Last night with some rash as well. Denies starting any new medications.

## 2014-10-31 LAB — BILE ACIDS, TOTAL: Bile Acids Total: 11 umol/L (ref 4.7–24.5)

## 2014-12-01 ENCOUNTER — Ambulatory Visit: Payer: Self-pay | Admitting: Obstetrics & Gynecology

## 2014-12-10 ENCOUNTER — Ambulatory Visit (INDEPENDENT_AMBULATORY_CARE_PROVIDER_SITE_OTHER): Payer: Medicaid Other | Admitting: Physician Assistant

## 2014-12-10 ENCOUNTER — Encounter: Payer: Self-pay | Admitting: Physician Assistant

## 2014-12-10 VITALS — BP 111/58 | HR 70 | Temp 98.3°F | Resp 20 | Wt 151.7 lb

## 2014-12-10 DIAGNOSIS — Z3042 Encounter for surveillance of injectable contraceptive: Secondary | ICD-10-CM

## 2014-12-10 DIAGNOSIS — Z3202 Encounter for pregnancy test, result negative: Secondary | ICD-10-CM

## 2014-12-10 MED ORDER — MEDROXYPROGESTERONE ACETATE 150 MG/ML IM SUSP
150.0000 mg | Freq: Once | INTRAMUSCULAR | Status: AC
  Administered 2014-12-10: 150 mg via INTRAMUSCULAR

## 2014-12-10 NOTE — Progress Notes (Deleted)
Subjective:     Suzanne Mccoy is a 34 y.o. female who presents for a postpartum visit. She is approximately 8 weeks postpartum following a spontaneous vaginal delivery. I have fully reviewed the prenatal and intrapartum course. The delivery was at 36.5  gestational weeks. Outcome: normal.  Anesthesia: none. Postpartum course has been remarkable for bilateral breast pain since 2wks after baby's birth. Pt also reports pain in Lt leg which prevents her from many activities. Baby's course has been complicated by constipation. Baby is feeding by breast.   Bleeding - none.  Bowel function is normal.  Bladder function is normal. Patient is not sexually active. Contraception method is undecided. Postpartum depression screening : negative.   {Common ambulatory SmartLinks:19316}  Review of Systems {ros; complete:30496}   Objective:    BP 111/58 mmHg  Pulse 70  Temp(Src) 98.3 F (36.8 C) (Oral)  Resp 20  Wt 151 lb 11.2 oz (68.811 kg)  Breastfeeding? Yes  General:  {gen appearance:16600}   Breasts:  {breast exam:1202::"inspection negative, no nipple discharge or bleeding, no masses or nodularity palpable"}  Lungs: {lung exam:16931}  Heart:  {heart exam:5510}  Abdomen: {abdomen exam:16834}   Vulva:  {labia exam:12198}  Vagina: {vagina exam:12200}  Cervix:  {cervix exam:14595}  Corpus: {uterus exam:12215}  Adnexa:  {adnexa exam:12223}  Rectal Exam: {rectal/vaginal exam:12274}        Assessment:    *** postpartum exam. Pap smear {done:10129} at today's visit.   Plan:    1. Contraception: {method:5051} 2. *** 3. Follow up in: {1-10:13787} {time; units:19136} or as needed.

## 2014-12-10 NOTE — Progress Notes (Signed)
Pt given contact information for [facility] and Lactation.

## 2014-12-10 NOTE — Progress Notes (Signed)
Patient ID: Suzanne Mccoy, female   DOB: 01/09/1981, 34 y.o.   MRN: [id number] History:  Suzanne Mccoy is a 34 y.o. [id number] who presents to clinic today for postpartum exam.   She is approximately 8 weeks postpartum following a spontaneous vaginal delivery. I have fully reviewed the prenatal and intrapartum course. The delivery was at 36.5 gestational weeks. Outcome: normal. Anesthesia: none. Postpartum course has been remarkable for bilateral breast pain since 2wks after baby's birth. Pt also reports pain in Lt leg which prevents her from many activities. Baby's course has been complicated by constipation. Baby is feeding by breast. Bleeding - none. Bowel function is normal. Bladder function is normal. Patient is not sexually active. Contraception method is undecided. Postpartum depression screening : negative.  She had h/o DVT previously in her left leg (for which she got Lovenox on 10/17 to use daily x 5 weeks).  With that DVT she started having left leg pain.  It has been ongoing x greater than 1 year now.  It has improved since birth of child but she desires eval for this.  She is also c/o breast pain with breastfeeding.  She denies difficulty with vaginal bleeding, abdominal pain, dysuria, weakness, fever.  Pt desires Depo.  She has previously used.  The following portions of the patient's history were reviewed and updated as appropriate: allergies, current medications, past family history, past medical history, past social history, past surgical history and problem list.  Review of Systems:  Pertinent items noted in HPI and remainder of comprehensive ROS otherwise negative.  Objective:  Physical Exam BP 111/58 mmHg  Pulse 70  Temp(Src) 98.3 F (36.8 C) (Oral)  Resp 20  Wt 151 lb 11.2 oz (68.811 kg)  Breastfeeding? Yes GENERAL: Well-developed, well-nourished female in no acute distress.  HEENT: Normocephalic, atraumatic.  NECK: Supple. Normal thyroid.  RESPIRATORY: Normal rate.  Clear to auscultation bilaterally.  CARDIOVASCULAR: Regular rate and rhythm with no adventitious sounds.  ABDOMEN: Soft, nontender, nondistended. No organomegaly. Normal bowel sounds appreciated in all quadrants.  PELVIC: Normal external female genitalia. Vagina is pink and rugated.  Normal discharge. Normal cervix contour. Pap smear obtained. Uterus is normal in size. No adnexal mass or tenderness.  EXTREMITIES: No cyanosis, clubbing, or edema, 2+ distal pulses.   Labs and Imaging No results found.  Assessment & Plan:  Assessment: Postpartum female beginning Depo  Plans: Send for PCP to manage leg pain Send to lactation for breastfeeding concerns Begin Depo today.   Return in 12 weeks for next depo Condoms x 2 weeks  Annual exam in 1 year.  May return PRN  Bertram DenverKaren E Teague Clark, PA-C 12/10/2014 5:09 PM

## 2015-01-15 ENCOUNTER — Ambulatory Visit (HOSPITAL_COMMUNITY)
Admission: RE | Admit: 2015-01-15 | Discharge: 2015-01-15 | Disposition: A | Discharge status: Home / Self Care | Payer: Medicaid Other | Source: Ambulatory Visit | Attending: Obstetrics & Gynecology | Admitting: Obstetrics & Gynecology

## 2015-01-15 NOTE — Lactation Note (Signed)
Lactation Consult (maternal concern)  Mother's reason for visit: throbbing pain in breasts during feedings and in-between Consult:  Initial Lactation Consultant:  Olivia Mackie, Lonzo Saulter Hamilton  ________________________________________________________________________ BW: 3200g (7# 0.9oz) Today's weight: 6850g (15# 1.6oz)  ________________________________________________________________________  Mother's Name: Akiko Azbell Type of delivery:   Breastfeeding Experience: this is her 4th child Maternal Medical Conditions:  None   ________________________________________________________________________  Breastfeeding History (Post Discharge)  Frequency of breastfeeding: he feeds when he cues Duration of feeding: as Verdon Cummins needs  Patient does not supplement or pump.  Infant Intake and Output Assessment  Voids: 10 in 24 hrs.  Color:  Clear yellow Stools: 10 in 24 hrs.    ________________________________________________________________________  Maternal Breast Assessment  Breast:  Soft Nipple:  Erect  Bottle-fed formula when out in public (he has had 1 bottle this week) _______________________________________________________  ________________ Feeding Assessment/Evaluation  Initial feeding assessment:  Infant's oral assessment:  WNL   Attached assessment:  Deep  Lips flanged:  Yes.     Suck assessment:  Nutritive   Pre-feed weight: 6850 g   Post-feed weight: 6900 g  Amount transferred: 50 ml, in about 10 minutes.   Total amount transferred: 50 ml  Swahili interpreter Redgie Grayer present during consult. Via interpreter, Mom reports throbbing pain mostly at night, but sometimes during the day. This (deep) throbbing pain happens both when feeding and not feeding. The location of the pain tends to be somewhat diffuse, but also along the outer quadrants. Per Mom, it has been happening over the last 2 months. It sometimes occurs twice/week and it lasts for about 1  hour. Mom says she sometimes feels chills and she will cover up with a blanket & that the chill goes away when the pain goes away. The last time she felt it was early this morning around 0200. During the exam, Mom was noted to be wearing a bra that was too small, but Mom reports that she has bigger bras at home.   Mom feels afebrile & her breast exam was benign (intact nipples, soft breasts, no streaks, no lumps or plugged ducts palpated, no redness of skin). After much conversation, this throbbing pain does not seem to be a painful let-down or a plugged duct. Mom denies sensitivity to cold or hx of migraines. The husband also says that there is no change in coloration of skin when Mom feels this throbbing pain.  This is likely musculoskeletal in nature or possibly vasospasm. I recommended that Mom apply heat to her breasts.   Of note, Mom had believed that she had low milk supply b/c her breasts are soft. (The father had also asked if the shot she received after the birth of her stillborn that suppressed lactation was the cause of these pains). I explained decrease in fullness that tends to happen after multiple births. Verdon Cummins is 8# over his BW and is primarily breastfed (she only gives him formula when she is out in public. So far this week, Verdon Cummins has only received a total of about 3 oz of formula). Verdon Cummins was observed at the breast. He is social & nurses much like one would expect a 33-month old to nurse.   If the application of heat isn't helpful, I recommended that she write down when and how long this throbbing occurs. If she feels chills, I requested that she take her temperature and write that down, also. Mom understands that since she is lactating, she can also contact the clinic where she received prenatal care if  these throbbing pains worsen.   Glenetta HewKim [place], RN, IBCLC

## 2015-02-25 ENCOUNTER — Ambulatory Visit (INDEPENDENT_AMBULATORY_CARE_PROVIDER_SITE_OTHER): Payer: Medicaid Other | Admitting: General Practice

## 2015-02-25 VITALS — BP 103/56 | HR 69 | Temp 97.9°F | Ht 62.0 in | Wt 168.0 lb

## 2015-02-25 DIAGNOSIS — Z3042 Encounter for surveillance of injectable contraceptive: Secondary | ICD-10-CM

## 2015-02-25 MED ORDER — MEDROXYPROGESTERONE ACETATE 150 MG/ML IM SUSP
150.0000 mg | Freq: Once | INTRAMUSCULAR | Status: AC
  Administered 2015-02-25: 150 mg via INTRAMUSCULAR

## 2015-04-20 ENCOUNTER — Emergency Department (HOSPITAL_COMMUNITY)
Admission: EM | Admit: 2015-04-20 | Discharge: 2015-04-20 | Disposition: A | Discharge status: Home / Self Care | Payer: Medicaid Other | Attending: Emergency Medicine | Admitting: Emergency Medicine

## 2015-04-20 ENCOUNTER — Encounter (HOSPITAL_COMMUNITY): Payer: Self-pay | Admitting: Emergency Medicine

## 2015-04-20 DIAGNOSIS — D72829 Elevated white blood cell count, unspecified: Secondary | ICD-10-CM | POA: Insufficient documentation

## 2015-04-20 DIAGNOSIS — Z79899 Other long term (current) drug therapy: Secondary | ICD-10-CM | POA: Insufficient documentation

## 2015-04-20 DIAGNOSIS — N644 Mastodynia: Secondary | ICD-10-CM | POA: Diagnosis present

## 2015-04-20 DIAGNOSIS — N61 Mastitis without abscess: Secondary | ICD-10-CM | POA: Insufficient documentation

## 2015-04-20 DIAGNOSIS — R5383 Other fatigue: Secondary | ICD-10-CM | POA: Diagnosis not present

## 2015-04-20 LAB — CBC WITH DIFFERENTIAL/PLATELET
BASOS ABS: 0 10*3/uL (ref 0.0–0.1)
BASOS PCT: 0 %
Eosinophils Absolute: 0.5 10*3/uL (ref 0.0–0.7)
Eosinophils Relative: 3 %
HEMATOCRIT: 37.6 % (ref 36.0–46.0)
HEMOGLOBIN: 12.2 g/dL (ref 12.0–15.0)
Lymphocytes Relative: 16 %
Lymphs Abs: 2.3 10*3/uL (ref 0.7–4.0)
MCH: 25.6 pg — ABNORMAL LOW (ref 26.0–34.0)
MCHC: 32.4 g/dL (ref 30.0–36.0)
MCV: 78.8 fL (ref 78.0–100.0)
MONOS PCT: 7 %
Monocytes Absolute: 0.9 10*3/uL (ref 0.1–1.0)
NEUTROS ABS: 10.5 10*3/uL — AB (ref 1.7–7.7)
NEUTROS PCT: 74 %
Platelets: 238 10*3/uL (ref 150–400)
RBC: 4.77 MIL/uL (ref 3.87–5.11)
RDW: 13 % (ref 11.5–15.5)
WBC: 14.3 10*3/uL — AB (ref 4.0–10.5)

## 2015-04-20 LAB — COMPREHENSIVE METABOLIC PANEL
ALBUMIN: 3.7 g/dL (ref 3.5–5.0)
ALK PHOS: 86 U/L (ref 38–126)
ALT: 20 U/L (ref 14–54)
AST: 21 U/L (ref 15–41)
Anion gap: 11 (ref 5–15)
BILIRUBIN TOTAL: 0.8 mg/dL (ref 0.3–1.2)
BUN: 10 mg/dL (ref 6–20)
CALCIUM: 9.3 mg/dL (ref 8.9–10.3)
CO2: 18 mmol/L — AB (ref 22–32)
Chloride: 109 mmol/L (ref 101–111)
Creatinine, Ser: 0.7 mg/dL (ref 0.44–1.00)
GFR calc Af Amer: >60 mL/min (ref >60)
GFR calc non Af Amer: >60 mL/min (ref >60)
GLUCOSE: 104 mg/dL — AB (ref 65–99)
Potassium: 3.8 mmol/L (ref 3.5–5.1)
SODIUM: 138 mmol/L (ref 135–145)
Total Protein: 7.8 g/dL (ref 6.5–8.1)

## 2015-04-20 MED ORDER — CEPHALEXIN 500 MG PO CAPS: 500.0000 mg | ORAL_CAPSULE | Freq: Two times a day (BID) | ORAL | Status: DC

## 2015-04-20 MED ORDER — CEPHALEXIN 250 MG PO CAPS
500.0000 mg | ORAL_CAPSULE | Freq: Once | ORAL | Status: AC
  Administered 2015-04-20: 500 mg via ORAL
  Filled 2015-04-20: qty 2

## 2015-04-20 MED ORDER — ACETAMINOPHEN 325 MG PO TABS
650.0000 mg | ORAL_TABLET | ORAL | Status: DC | PRN
  Administered 2015-04-20: 650 mg via ORAL
  Filled 2015-04-20: qty 2

## 2015-04-20 NOTE — ED Provider Notes (Signed)
CSN: [id number]     Arrival date & time 04/20/15  [id number] History   First MD Initiated Contact with Patient 04/20/15 [phone]     Chief Complaint  Patient presents with  . Breast Pain     (Consider location/radiation/quality/duration/timing/severity/associated sxs/prior Treatment) HPI Patient speaks Swahili. Husband is at bedside to translate. Complains of several days of bilateral breast pain starting on the left and now has had increased pain to the right breast over the last day. Has associated warmth and redness. Patient is currently breast-feeding. Has had subjective fever and chills. Denies shortness of breath or cough. Past Medical History  Diagnosis Date  . Medical history non-contributory    Past Surgical History  Procedure Laterality Date  . Cesarean section     History reviewed. No pertinent family history. Social History  Substance Use Topics  . Smoking status: Never Smoker   . Smokeless tobacco: None  . Alcohol Use: No   OB History    Gravida Para Term Preterm AB TAB SAB Ectopic Multiple Living   5 5 4 1      0 4     Review of Systems  Constitutional: Positive for fever, chills and fatigue.  Respiratory: Negative for cough and shortness of breath.   Gastrointestinal: Negative for nausea, vomiting, abdominal pain and diarrhea.  Musculoskeletal: Negative for back pain and neck pain.  Skin: Negative for rash and wound.  Neurological: Negative for dizziness, weakness, light-headedness, numbness and headaches.  All other systems reviewed and are negative.     Allergies  Review of patient's allergies indicates no known allergies.  Home Medications   Prior to Admission medications   Medication Sig Start Date End Date Taking? Authorizing Provider  cephALEXin (KEFLEX) 500 MG capsule Take 1 capsule (500 mg total) by mouth 2 (two) times daily. 04/20/15   Loren Raceravid Gared Gillie, MD  enoxaparin (LOVENOX) 40 MG/0.4ML injection Inject 0.4 mLs (40 mg total) into the skin daily.  10/27/14   Levie HeritageJacob J Stinson, DO  ibuprofen (ADVIL,MOTRIN) 600 MG tablet Take 1 tablet (600 mg total) by mouth every 6 (six) hours. Patient not taking: Reported on 10/30/2014 10/20/14   Montez MoritaMarie D Lawson, CNM  Prenatal Multivit-Min-Fe-FA (PRENATAL VITAMINS) 0.8 MG tablet Take 1 tablet by mouth daily. 09/24/14   Scot JunKaren E Teague Clark, PA-C   BP 101/65 mmHg  Pulse 81  Temp(Src) 98.5 F (36.9 C) (Oral)  Resp 15  SpO2 100% Physical Exam  Constitutional: She is oriented to person, place, and time. She appears well-developed and well-nourished. No distress.  HENT:  Head: Normocephalic and atraumatic.  Mouth/Throat: Oropharynx is clear and moist.  Eyes: EOM are normal. Pupils are equal, round, and reactive to light.  Neck: Normal range of motion. Neck supple.  Cardiovascular: Normal rate and regular rhythm.  Exam reveals no gallop and no friction rub.   No murmur heard. Pulmonary/Chest: Effort normal and breath sounds normal. No respiratory distress. She has no wheezes. She has no rales. She exhibits tenderness.    Abdominal: Soft. Bowel sounds are normal. She exhibits no distension and no mass. There is no tenderness. There is no rebound and no guarding.  Musculoskeletal: Normal range of motion. She exhibits no edema or tenderness.  Neurological: She is alert and oriented to person, place, and time.  Skin: Skin is warm and dry. No rash noted. No erythema.  Psychiatric: She has a normal mood and affect. Her behavior is normal.  Nursing note and vitals reviewed.   ED Course  Procedures (including  critical care time) Labs Review Labs Reviewed  CBC WITH DIFFERENTIAL/PLATELET - Abnormal; Notable for the following:    WBC 14.3 (*)    MCH 25.6 (*)    Neutro Abs 10.5 (*)    All other components within normal limits  COMPREHENSIVE METABOLIC PANEL - Abnormal; Notable for the following:    CO2 18 (*)    Glucose, Bld 104 (*)    All other components within normal limits    Imaging Review No  results found. I have personally reviewed and evaluated these images and lab results as part of my medical decision-making.   EKG Interpretation None      MDM   Final diagnoses:  Acute mastitis  Leukocytosis    Exam is consistent with mastitis. Low suspicion for abscess. Patient is well-appearing. Will start on Keflex and will advise cool compresses.  Patient is instructed to return in 2 days for reassessment. Junction is need to return immediately for any worsening of symptoms, fever or concerns.  Loren Racer, MD 04/20/15 [phone]

## 2015-04-20 NOTE — ED Notes (Signed)
Doctor at bedside.

## 2015-04-20 NOTE — Discharge Instructions (Signed)
Mastitis  Mastitis is inflammation of the breast tissue. It occurs most often in women who are breastfeeding, but it can also affect other women, and even sometimes men.  CAUSES   Mastitis is usually caused by a bacterial infection. Bacteria enter the breast tissue through cuts or openings in the skin. Typically, this occurs with breastfeeding because of cracked or irritated skin. Sometimes, it can occur even when there is no opening in the skin. It can be associated with plugged milk (lactiferous) ducts. Nipple piercing can also lead to mastitis. Also, some forms of breast cancer can cause mastitis.  SIGNS AND SYMPTOMS   · Swelling, redness, tenderness, and pain in an area of the breast.  · Swelling of the glands under the arm on the same side.  · Fever.  If an infection is allowed to progress, a collection of pus (abscess) may develop.  DIAGNOSIS   Your health care provider can usually diagnose mastitis based on your symptoms and a physical exam. Tests may be done to help confirm the diagnosis. These may include:   · Removal of pus from the breast by applying pressure to the area. This pus can be examined in the lab to determine which bacteria are present. If an abscess has developed, the fluid in the abscess can be removed with a needle. This can also be used to confirm the diagnosis and determine the bacteria present. In most cases, pus will not be present.  · Blood tests to determine if your body is fighting a bacterial infection.  · Mammogram or ultrasound tests to rule out other problems or diseases.  TREATMENT   Antibiotic medicine is used to treat a bacterial infection. Your health care provider will determine which bacteria are most likely causing the infection and will select an appropriate antibiotic. This is sometimes changed based on the results of tests performed to identify the bacteria, or if there is no response to the antibiotic selected. Antibiotics are usually given by mouth. You may also be  given medicine for pain.  Mastitis that occurs with breastfeeding will sometimes go away on its own, so your health care provider may choose to wait 24 hours after first seeing you to decide whether a prescription medicine is needed.  HOME CARE INSTRUCTIONS   · Only take over-the-counter or prescription medicines for pain, fever, or discomfort as directed by your health care provider.  · If your health care provider prescribed an antibiotic, take the medicine as directed. Make sure you finish it even if you start to feel better.  · Do not wear a tight or underwire bra. Wear a soft, supportive bra.  · Increase your fluid intake, especially if you have a fever.  · Women who are breastfeeding should follow these instructions:    Continue to empty the breast. Your health care provider can tell you whether this milk is safe for your infant or needs to be thrown out. You may be told to stop nursing until your health care provider thinks it is safe for your baby. Use a breast pump if you are advised to stop nursing.    Keep your nipples clean and dry.    Empty the first breast completely before going to the other breast. If your baby is not emptying your breasts completely for some reason, use a breast pump to empty your breasts.    If you go back to work, pump your breasts while at work to stay in time with your nursing schedule.      Avoid allowing your breasts to become overly filled with milk (engorged).  SEEK MEDICAL CARE IF:   · You have pus-like discharge from the breast.  · Your symptoms do not improve with the treatment prescribed by your health care provider within 2 days.  SEEK IMMEDIATE MEDICAL CARE IF:   · Your pain and swelling are getting worse.  · You have pain that is not controlled with medicine.  · You have a red line extending from the breast toward your armpit.  · You have a fever or persistent symptoms for more than 2-3 days.  · You have a fever and your symptoms suddenly get worse.     This information  is not intended to replace advice given to you by your health care provider. Make sure you discuss any questions you have with your health care provider.     Document Released: 12/27/2004 Document Revised: 01/01/2013 Document Reviewed: 07/27/2012  Elsevier Interactive Patient Education ©2016 [employer]

## 2015-04-20 NOTE — ED Notes (Signed)
Pt ambulates independently and with steady gait at time of discharge. Discharge instructions and follow up information reviewed with patient. No other questions or concerns voiced at this time.  

## 2015-04-20 NOTE — ED Notes (Signed)
Pt sts bilateral breast pain starting on left and now in both; pt currently breast feeding

## 2015-05-13 ENCOUNTER — Ambulatory Visit (INDEPENDENT_AMBULATORY_CARE_PROVIDER_SITE_OTHER): Payer: Medicaid Other | Admitting: *Deleted

## 2015-05-13 ENCOUNTER — Ambulatory Visit: Payer: Medicaid Other

## 2015-05-13 VITALS — BP 125/68 | HR 79 | Temp 98.3°F

## 2015-05-13 DIAGNOSIS — Z3042 Encounter for surveillance of injectable contraceptive: Secondary | ICD-10-CM

## 2015-05-13 MED ORDER — MEDROXYPROGESTERONE ACETATE 150 MG/ML IM SUSP
150.0000 mg | Freq: Once | INTRAMUSCULAR | Status: AC
  Administered 2015-05-13: 150 mg via INTRAMUSCULAR

## 2015-05-13 NOTE — Progress Notes (Signed)
Here for depo-provera. Used Interpreter Epimenio SarinNancy Uwera.

## 2015-05-22 ENCOUNTER — Ambulatory Visit: Payer: Medicaid Other | Admitting: Family Medicine

## 2015-07-29 ENCOUNTER — Ambulatory Visit: Payer: Medicaid Other

## 2016-01-23 IMAGING — US US MFM FETAL BPP W/O NON-STRESS
1 series · 12 of 21 positions shown · non-contrast
Comparison: none

[Series 1: us mfm fetal bpp w/o non-stress · 21 acquisitions, 12 frames shown]
[im 1/21]
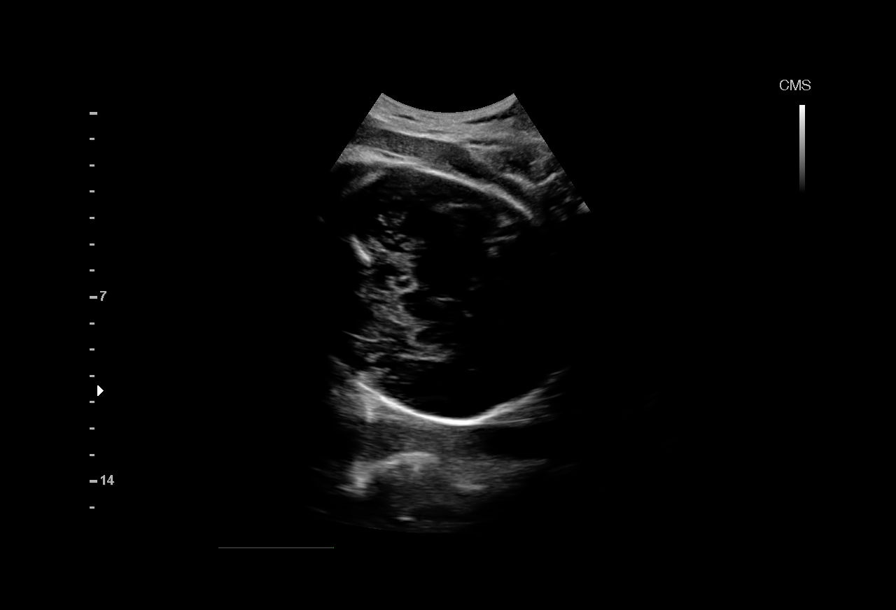
[im 3/21]
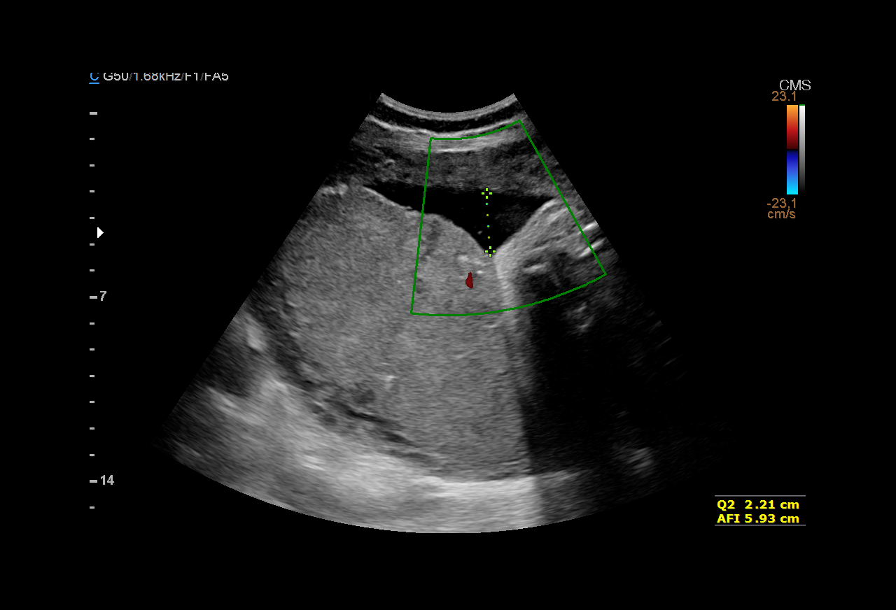
[im 5/21]
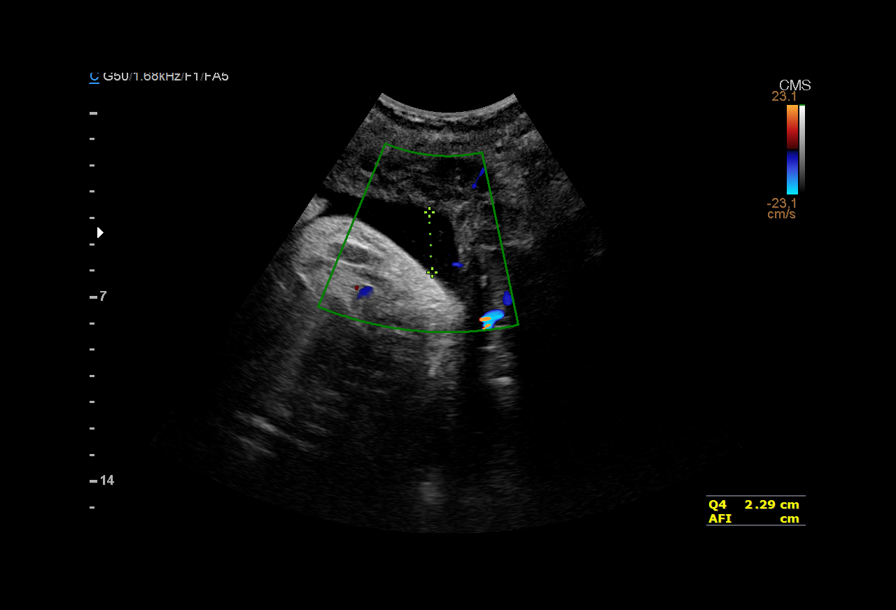
[im 7/21]
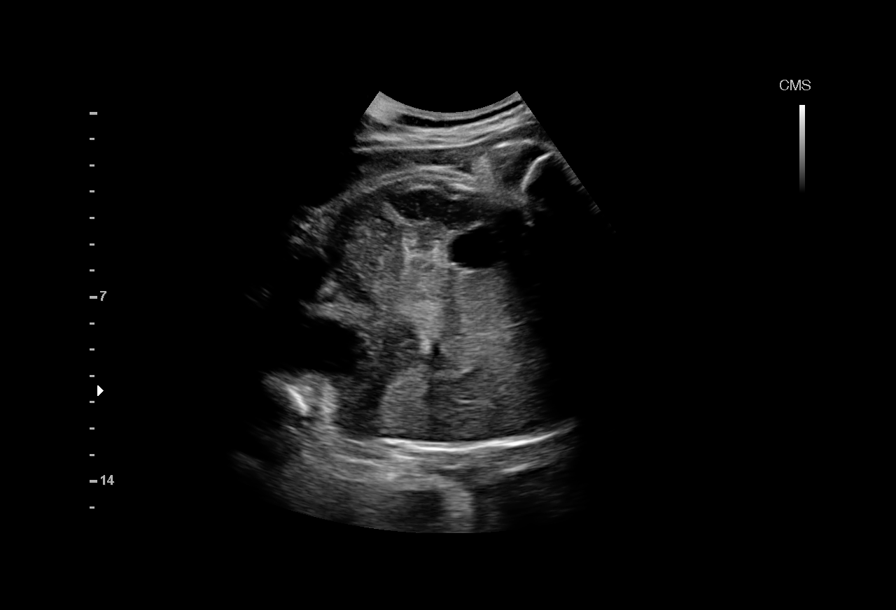
[im 8/21]
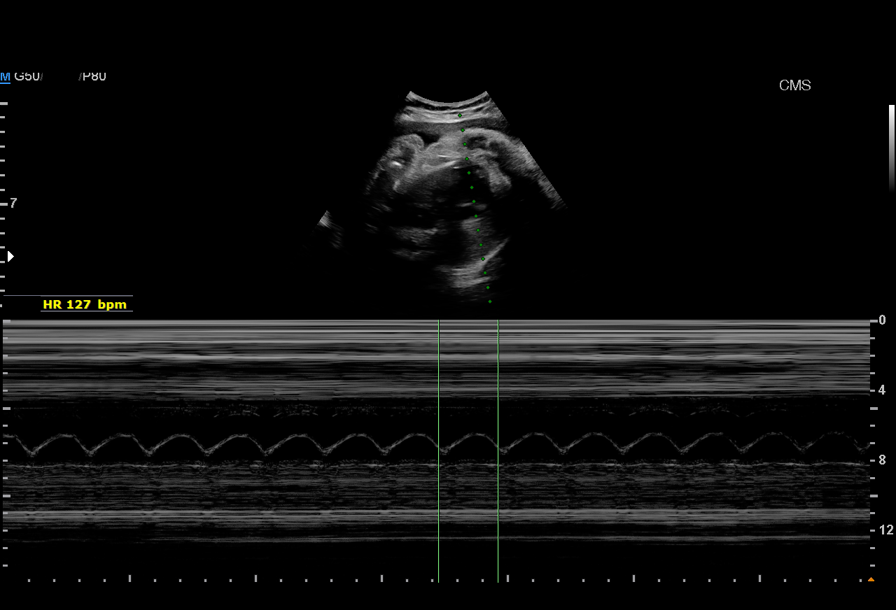
[im 10/21]
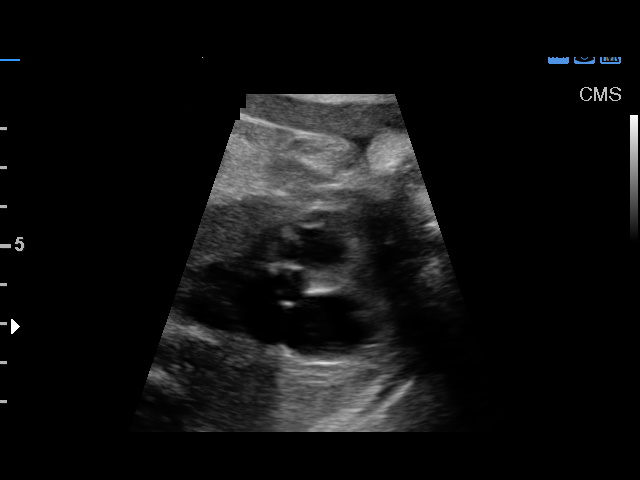
[im 12/21]
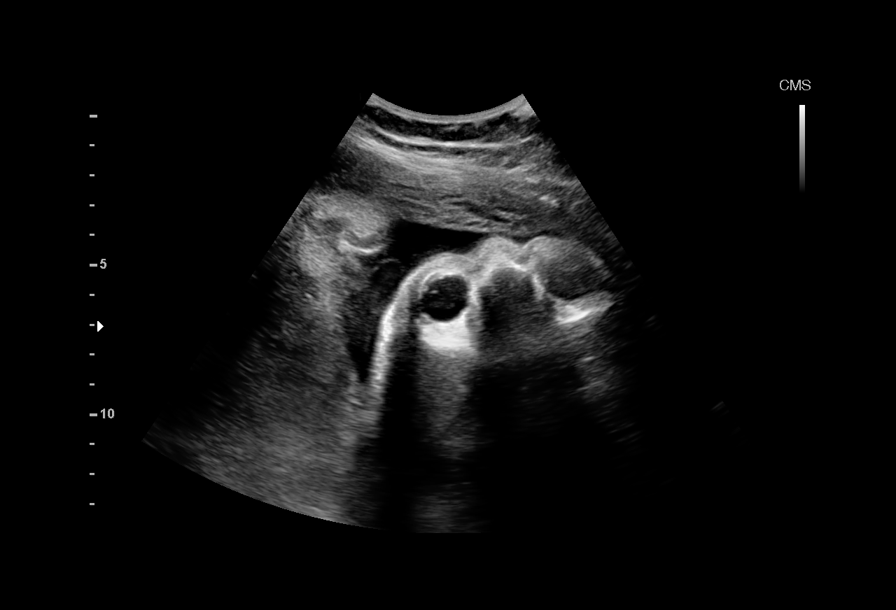
[im 14/21]
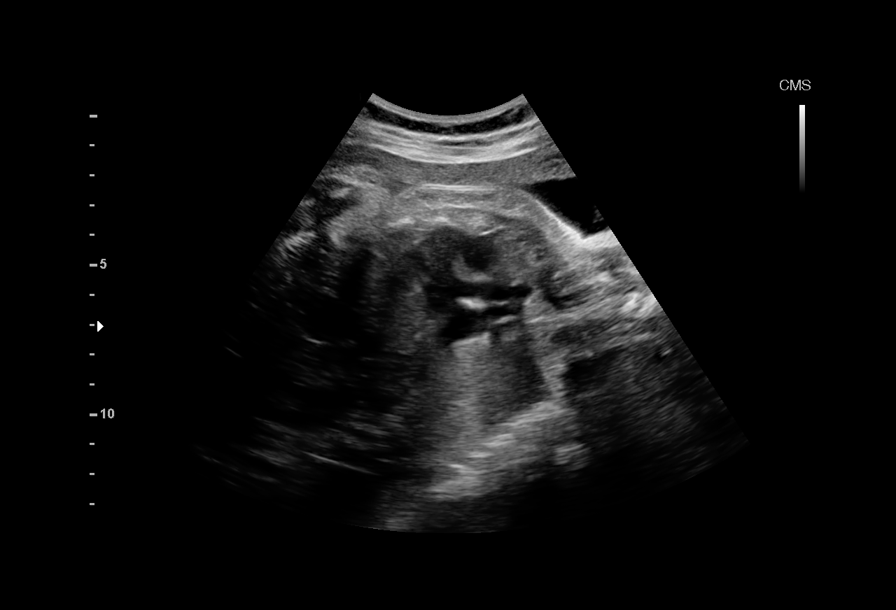
[im 15/21]
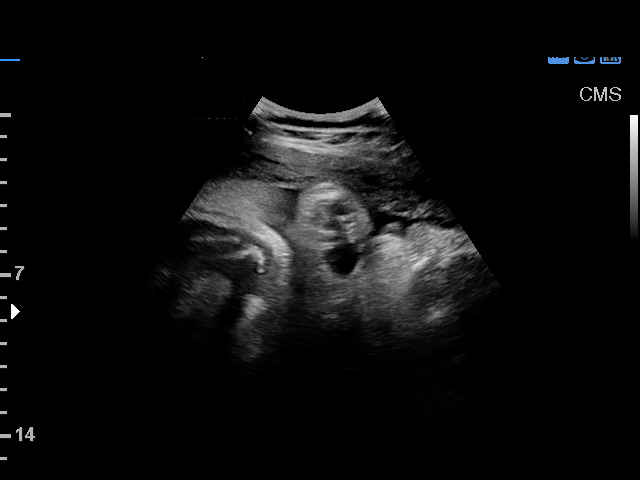
[im 17/21]
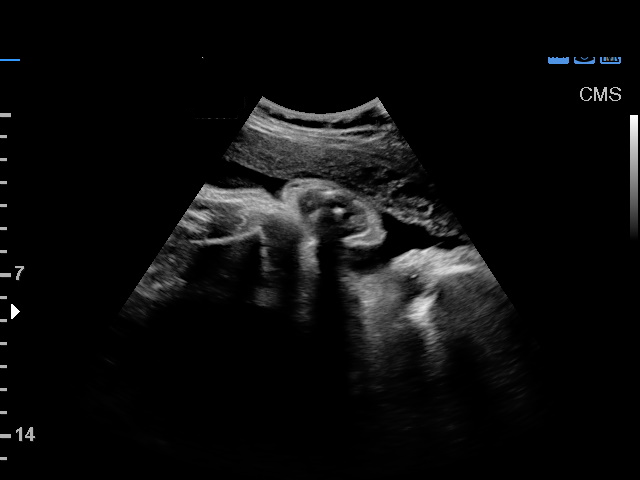
[im 19/21]
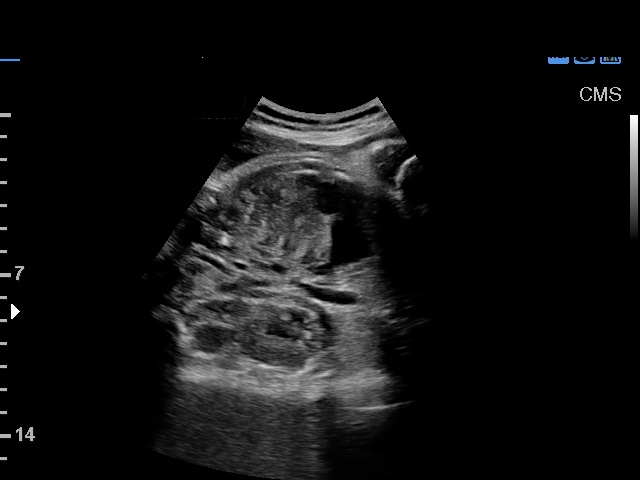
[im 21/21]
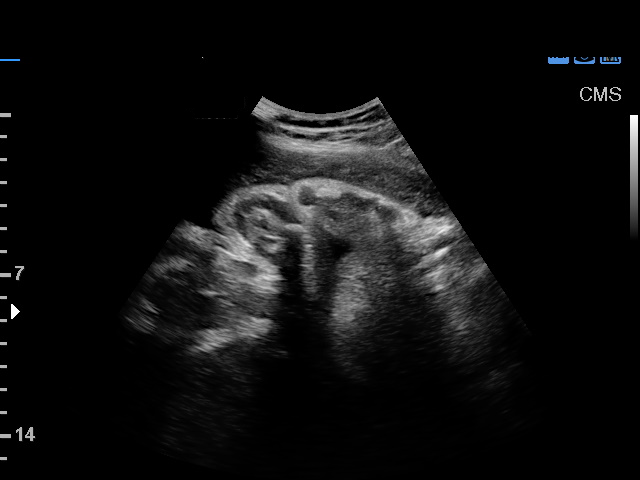

[12 of 21 positions shown; findings below may reference images not displayed]

OBSTETRICS REPORT
(Signed Final 10/13/2014 [DATE])

Date:

Service(s) Provided

Indications

No or Little Prenatal Care
Poor obstetric history: Previous IUFD (stillbirth) -
40 wks - c/s
Previous cesarean delivery, antepartum
36 weeks gestation of pregnancy
Fetal Evaluation

Num Of             1
Fetuses:
Fetal Heart        142                          bpm
Rate:
Cardiac Activity:  Observed
Presentation:      Cephalic

Amniotic Fluid
AFI FV:      Subjectively within normal limits
AFI Sum:     11.73    cm      35  %Tile     Larg Pckt:    3.72   cm
RUQ:   3.72    cm    RLQ:   2.29    cm   LUQ:    2.21    cm   LLQ:    3.51   cm
Biophysical Evaluation

Amniotic F.V:   Within normal limits        F. Tone:        Observed
F. Movement:    Observed                    Score:          [DATE]
F. Breathing:   Observed
Gestational Age

LMP:           36w 0d        Date:  02/03/14                  EDD:   11/10/14
Best:          36w 0d    Det. By:   LMP  (02/03/14)           EDD:   11/10/14
Impression

Single IUP at 36w 0d
Hx of previous term IUFD
BPP [DATE]
Normal amniotic fluid volume
Recommendations

Continue antenatal testing as scheduled.
Delivery by 39 weeks due to history of previous term IUFD.

[facility] with us.  Please do not hesitate to
